# Patient Record
Sex: Male | Born: 1935
Health system: Southern US, Community
[De-identification: ages and names within clinical notes are randomized; demographics above are authoritative.]

## PROBLEM LIST (undated history)

## (undated) DIAGNOSIS — N183 Chronic kidney disease, stage 3 unspecified: Secondary | ICD-10-CM

## (undated) DIAGNOSIS — N400 Enlarged prostate without lower urinary tract symptoms: Secondary | ICD-10-CM

## (undated) DIAGNOSIS — E119 Type 2 diabetes mellitus without complications: Secondary | ICD-10-CM

## (undated) DIAGNOSIS — E7800 Pure hypercholesterolemia, unspecified: Secondary | ICD-10-CM

## (undated) DIAGNOSIS — I10 Essential (primary) hypertension: Secondary | ICD-10-CM

## (undated) HISTORY — PX: PROSTATE SURGERY: SHX751

## (undated) HISTORY — PX: OTHER SURGICAL HISTORY: SHX169

---

## 2005-05-02 ENCOUNTER — Ambulatory Visit: Payer: Self-pay | Admitting: Internal Medicine

## 2005-05-13 ENCOUNTER — Ambulatory Visit: Payer: Self-pay | Admitting: Internal Medicine

## 2005-06-13 ENCOUNTER — Ambulatory Visit: Payer: Self-pay | Admitting: Internal Medicine

## 2005-07-14 ENCOUNTER — Ambulatory Visit: Payer: Self-pay | Admitting: Internal Medicine

## 2005-08-11 ENCOUNTER — Ambulatory Visit: Payer: Self-pay | Admitting: Internal Medicine

## 2006-11-20 ENCOUNTER — Other Ambulatory Visit: Payer: Self-pay

## 2006-11-21 ENCOUNTER — Other Ambulatory Visit: Payer: Self-pay

## 2006-11-21 ENCOUNTER — Inpatient Hospital Stay: Payer: Self-pay | Admitting: Internal Medicine

## 2006-12-13 ENCOUNTER — Ambulatory Visit: Payer: Self-pay | Admitting: Internal Medicine

## 2007-01-12 ENCOUNTER — Ambulatory Visit: Payer: Self-pay | Admitting: Internal Medicine

## 2007-11-05 ENCOUNTER — Emergency Department: Payer: Self-pay | Admitting: Emergency Medicine

## 2007-12-04 ENCOUNTER — Ambulatory Visit: Payer: Self-pay | Admitting: Gastroenterology

## 2015-02-09 ENCOUNTER — Other Ambulatory Visit: Payer: Self-pay | Admitting: Internal Medicine

## 2015-02-09 ENCOUNTER — Ambulatory Visit
Admission: RE | Admit: 2015-02-09 | Discharge: 2015-02-09 | Disposition: A | Discharge status: Home / Self Care | Payer: PPO | Source: Ambulatory Visit | Attending: Internal Medicine | Admitting: Internal Medicine

## 2015-02-09 DIAGNOSIS — R600 Localized edema: Secondary | ICD-10-CM | POA: Diagnosis present

## 2015-08-25 DIAGNOSIS — Z794 Long term (current) use of insulin: Secondary | ICD-10-CM | POA: Diagnosis not present

## 2015-08-25 DIAGNOSIS — N183 Chronic kidney disease, stage 3 (moderate): Secondary | ICD-10-CM | POA: Diagnosis not present

## 2015-08-25 DIAGNOSIS — E1122 Type 2 diabetes mellitus with diabetic chronic kidney disease: Secondary | ICD-10-CM | POA: Diagnosis not present

## 2015-09-01 DIAGNOSIS — E11649 Type 2 diabetes mellitus with hypoglycemia without coma: Secondary | ICD-10-CM | POA: Diagnosis not present

## 2015-09-01 DIAGNOSIS — N183 Chronic kidney disease, stage 3 (moderate): Secondary | ICD-10-CM | POA: Diagnosis not present

## 2015-09-01 DIAGNOSIS — E1122 Type 2 diabetes mellitus with diabetic chronic kidney disease: Secondary | ICD-10-CM | POA: Diagnosis not present

## 2015-09-01 DIAGNOSIS — E1129 Type 2 diabetes mellitus with other diabetic kidney complication: Secondary | ICD-10-CM | POA: Diagnosis not present

## 2015-09-01 DIAGNOSIS — Z794 Long term (current) use of insulin: Secondary | ICD-10-CM | POA: Diagnosis not present

## 2015-09-01 DIAGNOSIS — R809 Proteinuria, unspecified: Secondary | ICD-10-CM | POA: Diagnosis not present

## 2015-11-16 DIAGNOSIS — E1122 Type 2 diabetes mellitus with diabetic chronic kidney disease: Secondary | ICD-10-CM | POA: Diagnosis not present

## 2015-11-16 DIAGNOSIS — I10 Essential (primary) hypertension: Secondary | ICD-10-CM | POA: Diagnosis not present

## 2015-11-16 DIAGNOSIS — Z794 Long term (current) use of insulin: Secondary | ICD-10-CM | POA: Diagnosis not present

## 2015-11-16 DIAGNOSIS — E785 Hyperlipidemia, unspecified: Secondary | ICD-10-CM | POA: Diagnosis not present

## 2015-11-16 DIAGNOSIS — N183 Chronic kidney disease, stage 3 (moderate): Secondary | ICD-10-CM | POA: Diagnosis not present

## 2015-11-23 DIAGNOSIS — Z794 Long term (current) use of insulin: Secondary | ICD-10-CM | POA: Diagnosis not present

## 2015-11-23 DIAGNOSIS — I10 Essential (primary) hypertension: Secondary | ICD-10-CM | POA: Diagnosis not present

## 2015-11-23 DIAGNOSIS — E1122 Type 2 diabetes mellitus with diabetic chronic kidney disease: Secondary | ICD-10-CM | POA: Diagnosis not present

## 2015-11-23 DIAGNOSIS — G3184 Mild cognitive impairment, so stated: Secondary | ICD-10-CM | POA: Diagnosis not present

## 2015-11-23 DIAGNOSIS — N401 Enlarged prostate with lower urinary tract symptoms: Secondary | ICD-10-CM | POA: Diagnosis not present

## 2015-11-23 DIAGNOSIS — Z125 Encounter for screening for malignant neoplasm of prostate: Secondary | ICD-10-CM | POA: Diagnosis not present

## 2015-11-23 DIAGNOSIS — E785 Hyperlipidemia, unspecified: Secondary | ICD-10-CM | POA: Diagnosis not present

## 2015-11-23 DIAGNOSIS — N183 Chronic kidney disease, stage 3 (moderate): Secondary | ICD-10-CM | POA: Diagnosis not present

## 2016-02-29 DIAGNOSIS — N183 Chronic kidney disease, stage 3 (moderate): Secondary | ICD-10-CM | POA: Diagnosis not present

## 2016-02-29 DIAGNOSIS — E1122 Type 2 diabetes mellitus with diabetic chronic kidney disease: Secondary | ICD-10-CM | POA: Diagnosis not present

## 2016-02-29 DIAGNOSIS — Z794 Long term (current) use of insulin: Secondary | ICD-10-CM | POA: Diagnosis not present

## 2016-03-08 DIAGNOSIS — E1122 Type 2 diabetes mellitus with diabetic chronic kidney disease: Secondary | ICD-10-CM | POA: Diagnosis not present

## 2016-03-08 DIAGNOSIS — N183 Chronic kidney disease, stage 3 (moderate): Secondary | ICD-10-CM | POA: Diagnosis not present

## 2016-03-08 DIAGNOSIS — Z794 Long term (current) use of insulin: Secondary | ICD-10-CM | POA: Diagnosis not present

## 2016-03-08 DIAGNOSIS — R809 Proteinuria, unspecified: Secondary | ICD-10-CM | POA: Diagnosis not present

## 2016-03-08 DIAGNOSIS — E1129 Type 2 diabetes mellitus with other diabetic kidney complication: Secondary | ICD-10-CM | POA: Diagnosis not present

## 2016-04-11 DIAGNOSIS — H401132 Primary open-angle glaucoma, bilateral, moderate stage: Secondary | ICD-10-CM | POA: Diagnosis not present

## 2016-05-19 DIAGNOSIS — N183 Chronic kidney disease, stage 3 (moderate): Secondary | ICD-10-CM | POA: Diagnosis not present

## 2016-05-19 DIAGNOSIS — I10 Essential (primary) hypertension: Secondary | ICD-10-CM | POA: Diagnosis not present

## 2016-05-19 DIAGNOSIS — Z125 Encounter for screening for malignant neoplasm of prostate: Secondary | ICD-10-CM | POA: Diagnosis not present

## 2016-05-24 DIAGNOSIS — Z794 Long term (current) use of insulin: Secondary | ICD-10-CM | POA: Diagnosis not present

## 2016-05-24 DIAGNOSIS — N401 Enlarged prostate with lower urinary tract symptoms: Secondary | ICD-10-CM | POA: Diagnosis not present

## 2016-05-24 DIAGNOSIS — E1122 Type 2 diabetes mellitus with diabetic chronic kidney disease: Secondary | ICD-10-CM | POA: Diagnosis not present

## 2016-05-24 DIAGNOSIS — R350 Frequency of micturition: Secondary | ICD-10-CM | POA: Diagnosis not present

## 2016-05-24 DIAGNOSIS — I10 Essential (primary) hypertension: Secondary | ICD-10-CM | POA: Diagnosis not present

## 2016-05-24 DIAGNOSIS — Z0000 Encounter for general adult medical examination without abnormal findings: Secondary | ICD-10-CM | POA: Diagnosis not present

## 2016-05-24 DIAGNOSIS — N183 Chronic kidney disease, stage 3 (moderate): Secondary | ICD-10-CM | POA: Diagnosis not present

## 2016-05-24 DIAGNOSIS — E785 Hyperlipidemia, unspecified: Secondary | ICD-10-CM | POA: Diagnosis not present

## 2016-07-06 DIAGNOSIS — H401132 Primary open-angle glaucoma, bilateral, moderate stage: Secondary | ICD-10-CM | POA: Diagnosis not present

## 2016-07-15 DIAGNOSIS — H401132 Primary open-angle glaucoma, bilateral, moderate stage: Secondary | ICD-10-CM | POA: Diagnosis not present

## 2016-08-29 DIAGNOSIS — E1122 Type 2 diabetes mellitus with diabetic chronic kidney disease: Secondary | ICD-10-CM | POA: Diagnosis not present

## 2016-08-29 DIAGNOSIS — Z794 Long term (current) use of insulin: Secondary | ICD-10-CM | POA: Diagnosis not present

## 2016-08-29 DIAGNOSIS — N183 Chronic kidney disease, stage 3 (moderate): Secondary | ICD-10-CM | POA: Diagnosis not present

## 2016-09-07 DIAGNOSIS — R809 Proteinuria, unspecified: Secondary | ICD-10-CM | POA: Diagnosis not present

## 2016-09-07 DIAGNOSIS — Z794 Long term (current) use of insulin: Secondary | ICD-10-CM | POA: Diagnosis not present

## 2016-09-07 DIAGNOSIS — E1129 Type 2 diabetes mellitus with other diabetic kidney complication: Secondary | ICD-10-CM | POA: Diagnosis not present

## 2016-09-07 DIAGNOSIS — N183 Chronic kidney disease, stage 3 (moderate): Secondary | ICD-10-CM | POA: Diagnosis not present

## 2016-09-07 DIAGNOSIS — E1122 Type 2 diabetes mellitus with diabetic chronic kidney disease: Secondary | ICD-10-CM | POA: Diagnosis not present

## 2016-09-29 ENCOUNTER — Emergency Department: Payer: PPO

## 2016-09-29 ENCOUNTER — Emergency Department
Admission: EM | Admit: 2016-09-29 | Discharge: 2016-09-30 | Disposition: A | Discharge status: Home / Self Care | Payer: PPO | Attending: Emergency Medicine | Admitting: Emergency Medicine

## 2016-09-29 ENCOUNTER — Encounter: Payer: Self-pay | Admitting: Emergency Medicine

## 2016-09-29 DIAGNOSIS — I10 Essential (primary) hypertension: Secondary | ICD-10-CM | POA: Insufficient documentation

## 2016-09-29 DIAGNOSIS — R778 Other specified abnormalities of plasma proteins: Secondary | ICD-10-CM

## 2016-09-29 DIAGNOSIS — E119 Type 2 diabetes mellitus without complications: Secondary | ICD-10-CM | POA: Insufficient documentation

## 2016-09-29 DIAGNOSIS — N289 Disorder of kidney and ureter, unspecified: Secondary | ICD-10-CM

## 2016-09-29 DIAGNOSIS — R7989 Other specified abnormal findings of blood chemistry: Secondary | ICD-10-CM | POA: Insufficient documentation

## 2016-09-29 DIAGNOSIS — Z87891 Personal history of nicotine dependence: Secondary | ICD-10-CM | POA: Insufficient documentation

## 2016-09-29 HISTORY — DX: Pure hypercholesterolemia, unspecified: E78.00

## 2016-09-29 HISTORY — DX: Essential (primary) hypertension: I10

## 2016-09-29 HISTORY — DX: Type 2 diabetes mellitus without complications: E11.9

## 2016-09-29 LAB — CBC
HCT: 36.3 % — ABNORMAL LOW (ref 40.0–52.0)
HEMOGLOBIN: 12.3 g/dL — AB (ref 13.0–18.0)
MCH: 32.1 pg (ref 26.0–34.0)
MCHC: 33.9 g/dL (ref 32.0–36.0)
MCV: 94.8 fL (ref 80.0–100.0)
Platelets: 325 10*3/uL (ref 150–440)
RBC: 3.83 MIL/uL — AB (ref 4.40–5.90)
RDW: 12.3 % (ref 11.5–14.5)
WBC: 6.4 10*3/uL (ref 3.8–10.6)

## 2016-09-29 LAB — BASIC METABOLIC PANEL
ANION GAP: 7 (ref 5–15)
BUN: 31 mg/dL — ABNORMAL HIGH (ref 6–20)
CALCIUM: 9.4 mg/dL (ref 8.9–10.3)
CO2: 27 mmol/L (ref 22–32)
Chloride: 101 mmol/L (ref 101–111)
Creatinine, Ser: 1.76 mg/dL — ABNORMAL HIGH (ref 0.61–1.24)
GFR, EST AFRICAN AMERICAN: 39 mL/min — AB (ref >60)
GFR, EST NON AFRICAN AMERICAN: 34 mL/min — AB (ref >60)
Glucose, Bld: 142 mg/dL — ABNORMAL HIGH (ref 65–99)
Potassium: 4.4 mmol/L (ref 3.5–5.1)
SODIUM: 135 mmol/L (ref 135–145)

## 2016-09-29 LAB — TROPONIN I
TROPONIN I: 0.15 ng/mL — AB (ref <0.03)
TROPONIN I: 0.15 ng/mL — AB (ref <0.03)

## 2016-09-29 MED ORDER — ASPIRIN 81 MG PO CHEW
324.0000 mg | CHEWABLE_TABLET | Freq: Once | ORAL | Status: AC
  Administered 2016-09-29: 243 mg via ORAL

## 2016-09-29 MED ORDER — ASPIRIN 81 MG PO CHEW
CHEWABLE_TABLET | ORAL | Status: AC
  Filled 2016-09-29: qty 4

## 2016-09-29 NOTE — ED Provider Notes (Signed)
Clinical Course as of Apr 20 0000  Thu Sep 29, 2016  2330 Assuming care from Dr. Burlene Arnt vis Dr. Kerman Passey.  In short, William Morse is a 83 y.o. male with a chief complaint of hypertension and elevated troponin I.  Refer to the original H&P for additional details.  The current plan of care is to follow up the repeat troponin.  If patient remains asymptomatic and repeat troponin is essentially unchanged, the patient will be discharged for outpatient follow up.  At no point has he had any chest pain nor shortness of breath.   [CF]  Fri Sep 30, 2016  0000 The patient has been stable.  I reassessed him and he has remained asymptomatic.  He and his son are comfortable with the plan for discharge and outpatient follow-up.  His second troponin was unchanged at 0.15.  I gave my usual and customary return precautions.  [CF]    Clinical Course User Index [CF] Hinda Kehr, MD      Hinda Kehr, MD 09/30/16 0000

## 2016-09-29 NOTE — ED Notes (Signed)
Dr. Kerman Passey gave verbal order for pt to take usual home med of lisinopril HCTZ at this time from own meds that pt brought with him.

## 2016-09-29 NOTE — ED Provider Notes (Addendum)
[facility] Emergency Department Provider Note  ____________________________________________   I have reviewed the triage vital signs and the nursing notes.   HISTORY  Chief Complaint Hypertension    HPI William Morse is a 83 y.o. male who is hypertensive and has a history of diabetes. Patient was watching the evening news, he became upset about the way the world is going, and then he felt that he should check his blood pressure. He checked his blood pressure and it was elevated. He states at no time did he have any chest pain or shortness of breath he denies any nausea or vomiting or abdominal pain he denies any headache or any symptoms with this elevated blood pressure. He did take his blood pressure medication earlier today. He comes in therefore complaining of elevated blood pressure but no other complaints. In triage, the patient had blood work drawn including a troponin by protocol. Patient has a known left bundle-branch block from prior EKGs. At this time he has no complaints.  He has no known significant heart disease. His baseline creatinine is 1.7 from clinic results.  Past Medical History:  Diagnosis Date  . Diabetes mellitus without complication ([place])   . Hypercholesteremia   . Hypertension     There are no active problems to display for this patient.   History reviewed. No pertinent surgical history.  Prior to Admission medications   Not on File    Allergies Patient has no known allergies.  No family history on file.  Social History Social History  Substance Use Topics  . Smoking status: Former Research scientist (life sciences)  . Smokeless tobacco: Never Used  . Alcohol use Not on file    Review of Systems Constitutional: No fever/chills Eyes: No visual changes. ENT: No sore throat. No stiff neck no neck pain Cardiovascular: Denies chest pain. Respiratory: Denies shortness of breath. Gastrointestinal:   no vomiting.  No diarrhea.  No  constipation. Genitourinary: Negative for dysuria. Musculoskeletal: Negative lower extremity swelling Skin: Negative for rash. Neurological: Negative for severe headaches, focal weakness or numbness. 10-point ROS otherwise negative.  ____________________________________________   PHYSICAL EXAM:  VITAL SIGNS: ED Triage Vitals  Enc Vitals Group     BP 09/29/16 1922 (!) 177/74     Pulse Rate 09/29/16 1922 83     Resp 09/29/16 1922 18     Temp 09/29/16 1922 97.7 F (36.5 C)     Temp Source 09/29/16 1922 Oral     SpO2 09/29/16 1922 99 %     Weight 09/29/16 1923 175 lb (79.4 kg)     Height 09/29/16 1923 5\' 7"  (1.702 m)     Head Circumference --      Peak Flow --      Pain Score --      Pain Loc --      Pain Edu? --      Excl. in [place]? --     Constitutional: Alert and oriented. Well appearing and in no acute distress. Eyes: Conjunctivae are normal. PERRL. EOMI. Head: Atraumatic. Nose: No congestion/rhinnorhea. Mouth/Throat: Mucous membranes are moist.  Oropharynx non-erythematous. Neck: No stridor.   Nontender with no meningismus Cardiovascular: Normal rate, regular rhythm. Grossly normal heart sounds.  Good peripheral circulation. Respiratory: Normal respiratory effort.  No retractions. Lungs CTAB. Abdominal: Soft and nontender. No distention. No guarding no rebound Back:  There is no focal tenderness or step off.  there is no midline tenderness there are no lesions noted. there is no CVA tenderness  Musculoskeletal: No lower extremity tenderness, no upper extremity tenderness. No joint effusions, no DVT signs strong distal pulses no edema Neurologic:  Normal speech and language. No gross focal neurologic deficits are appreciated.  Skin:  Skin is warm, dry and intact. No rash noted. Psychiatric: Mood and affect are normal. Speech and behavior are normal.  ____________________________________________   LABS (all labs ordered are listed, but only abnormal results are  displayed)  Labs Reviewed  BASIC METABOLIC PANEL - Abnormal; Notable for the following:       Result Value   Glucose, Bld 142 (*)    BUN 31 (*)    Creatinine, Ser 1.76 (*)    GFR calc non Af Amer 34 (*)    GFR calc Af Amer 39 (*)    All other components within normal limits  CBC - Abnormal; Notable for the following:    RBC 3.83 (*)    Hemoglobin 12.3 (*)    HCT 36.3 (*)    All other components within normal limits  TROPONIN I - Abnormal; Notable for the following:    Troponin I 0.15 (*)    All other components within normal limits   ____________________________________________  EKG  I personally interpreted any EKGs ordered by me or triage No significant change from prior. Rate 75 bpm, no significant change from prior ____________________________________________  RADIOLOGY  I reviewed any imaging ordered by me or triage that were performed during my shift and, if possible, patient and/or family made aware of any abnormal findings. ____________________________________________   PROCEDURES  Procedure(s) performed: None  Procedures  Critical Care performed: None  ____________________________________________   INITIAL IMPRESSION / ASSESSMENT AND PLAN / ED COURSE  Pertinent labs & imaging results that were available during my care of the patient were reviewed by me and considered in my medical decision making (see chart for details).  Patient here with no symptoms or complaints aside from he noticed his blood pressure was up when he was anxious about the TV that he was watching. Unfortunately, by protocol we did obtain blood work which showed a troponin of 0.15. Patient does have a renal insufficiency with a low GFR, which certainly could affect his troponin. He persists in having no symptoms his blood pressures correcting itself as he relaxes. Patient and family feel that he was just anxious. He had no chest pain or any other symptoms. I did discuss with cardiology, Dr.  Ubaldo Glassing, all of these findings including patient's EKG, baseline EKG, troponin, history and physical and vitals and lab findings. He feels the patient should've a second troponin and given that the patient is asymptomatic, if he remains without any significant increase in his elevation of his troponin he is to go home and see them tomorrow in the clinic. If his troponin goes higher, he will need to be admitted. Patient reluctantly agrees to stay, he has no complaints or symptoms and he would like to go home but he does understand our reasoning and does consent to stay for further testing.   ----------------------------------------- 10:19 PM on 09/29/2016 -----------------------------------------  She received aspirin remains asymptomatic signed out at the end of my shift to Dr. Kerman Passey who will follow-up on troponin and pt dispo    ____________________________________________   FINAL CLINICAL IMPRESSION(S) / ED DIAGNOSES  Final diagnoses:  None      This chart was dictated using voice recognition software.  Despite best efforts to proofread,  errors can occur which can change meaning.  Schuyler Amor, MD 09/29/16 2132    Schuyler Amor, MD 09/29/16 2220

## 2016-09-29 NOTE — ED Triage Notes (Signed)
Patient states that he has a history of hypertension that is normally well controlled with his medications. Patient reports that today at home his blood pressure was 200/103. Patient went to urgent care and had a blood pressure of 180/90 and was told to come to ED.

## 2016-09-29 NOTE — ED Notes (Signed)
Patient states he did take his bp medication this morning. Normal bp for patient is 957'M systolic. Patient denies any headache, blurred vision or dizziness.

## 2016-09-30 NOTE — Discharge Instructions (Signed)
As we discussed, though you do have high blood pressure (hypertension), fortunately it is not immediately dangerous at this time and does not need emergency intervention or admission to the hospital.  If we add to or change your regular medications, we may cause more harm than good - it is more appropriate for your primary care doctor to evaluate you in clinic and decide if any medication changes are needed.  Please follow up in clinic as recommended in these papers.    We do strongly encourage you to follow up with your regular doctor to discuss your kidney function and your elevated troponin.  Since you have had no chest pain or shortness of breath, and the rest of your evaluation is reassuring, there is no need to keep you in the hospital, but it is important to follow-up as an outpatient.  Return to the Emergency Department (ED) if you experience any worsening chest pain/pressure/tightness, difficulty breathing, or sudden sweating, or other symptoms that concern you.

## 2016-10-26 DIAGNOSIS — H401132 Primary open-angle glaucoma, bilateral, moderate stage: Secondary | ICD-10-CM | POA: Diagnosis not present

## 2016-11-21 DIAGNOSIS — N183 Chronic kidney disease, stage 3 (moderate): Secondary | ICD-10-CM | POA: Diagnosis not present

## 2016-11-21 DIAGNOSIS — E785 Hyperlipidemia, unspecified: Secondary | ICD-10-CM | POA: Diagnosis not present

## 2016-11-23 DIAGNOSIS — E785 Hyperlipidemia, unspecified: Secondary | ICD-10-CM | POA: Diagnosis not present

## 2016-11-23 DIAGNOSIS — G3184 Mild cognitive impairment, so stated: Secondary | ICD-10-CM | POA: Diagnosis not present

## 2016-11-23 DIAGNOSIS — Z794 Long term (current) use of insulin: Secondary | ICD-10-CM | POA: Diagnosis not present

## 2016-11-23 DIAGNOSIS — N183 Chronic kidney disease, stage 3 (moderate): Secondary | ICD-10-CM | POA: Diagnosis not present

## 2016-11-23 DIAGNOSIS — I10 Essential (primary) hypertension: Secondary | ICD-10-CM | POA: Diagnosis not present

## 2016-11-23 DIAGNOSIS — E1122 Type 2 diabetes mellitus with diabetic chronic kidney disease: Secondary | ICD-10-CM | POA: Diagnosis not present

## 2016-11-23 DIAGNOSIS — N529 Male erectile dysfunction, unspecified: Secondary | ICD-10-CM | POA: Diagnosis not present

## 2017-03-21 DIAGNOSIS — E1122 Type 2 diabetes mellitus with diabetic chronic kidney disease: Secondary | ICD-10-CM | POA: Diagnosis not present

## 2017-03-21 DIAGNOSIS — Z794 Long term (current) use of insulin: Secondary | ICD-10-CM | POA: Diagnosis not present

## 2017-03-21 DIAGNOSIS — E1129 Type 2 diabetes mellitus with other diabetic kidney complication: Secondary | ICD-10-CM | POA: Diagnosis not present

## 2017-03-21 DIAGNOSIS — R809 Proteinuria, unspecified: Secondary | ICD-10-CM | POA: Diagnosis not present

## 2017-03-21 DIAGNOSIS — N183 Chronic kidney disease, stage 3 (moderate): Secondary | ICD-10-CM | POA: Diagnosis not present

## 2017-03-28 DIAGNOSIS — Z794 Long term (current) use of insulin: Secondary | ICD-10-CM | POA: Diagnosis not present

## 2017-03-28 DIAGNOSIS — E1122 Type 2 diabetes mellitus with diabetic chronic kidney disease: Secondary | ICD-10-CM | POA: Diagnosis not present

## 2017-03-28 DIAGNOSIS — E1129 Type 2 diabetes mellitus with other diabetic kidney complication: Secondary | ICD-10-CM | POA: Diagnosis not present

## 2017-03-28 DIAGNOSIS — N183 Chronic kidney disease, stage 3 (moderate): Secondary | ICD-10-CM | POA: Diagnosis not present

## 2017-03-28 DIAGNOSIS — R809 Proteinuria, unspecified: Secondary | ICD-10-CM | POA: Diagnosis not present

## 2017-05-19 DIAGNOSIS — E785 Hyperlipidemia, unspecified: Secondary | ICD-10-CM | POA: Diagnosis not present

## 2017-05-19 DIAGNOSIS — I10 Essential (primary) hypertension: Secondary | ICD-10-CM | POA: Diagnosis not present

## 2017-05-19 DIAGNOSIS — N183 Chronic kidney disease, stage 3 (moderate): Secondary | ICD-10-CM | POA: Diagnosis not present

## 2017-05-26 DIAGNOSIS — Z0000 Encounter for general adult medical examination without abnormal findings: Secondary | ICD-10-CM | POA: Diagnosis not present

## 2017-05-26 DIAGNOSIS — N183 Chronic kidney disease, stage 3 (moderate): Secondary | ICD-10-CM | POA: Diagnosis not present

## 2017-05-26 DIAGNOSIS — G3184 Mild cognitive impairment, so stated: Secondary | ICD-10-CM | POA: Diagnosis not present

## 2017-05-26 DIAGNOSIS — Z794 Long term (current) use of insulin: Secondary | ICD-10-CM | POA: Diagnosis not present

## 2017-05-26 DIAGNOSIS — R350 Frequency of micturition: Secondary | ICD-10-CM | POA: Diagnosis not present

## 2017-05-26 DIAGNOSIS — I10 Essential (primary) hypertension: Secondary | ICD-10-CM | POA: Diagnosis not present

## 2017-05-26 DIAGNOSIS — E785 Hyperlipidemia, unspecified: Secondary | ICD-10-CM | POA: Diagnosis not present

## 2017-05-26 DIAGNOSIS — N401 Enlarged prostate with lower urinary tract symptoms: Secondary | ICD-10-CM | POA: Diagnosis not present

## 2017-05-26 DIAGNOSIS — E1122 Type 2 diabetes mellitus with diabetic chronic kidney disease: Secondary | ICD-10-CM | POA: Diagnosis not present

## 2017-05-26 DIAGNOSIS — Z8659 Personal history of other mental and behavioral disorders: Secondary | ICD-10-CM | POA: Diagnosis not present

## 2017-07-05 ENCOUNTER — Encounter: Payer: Self-pay | Admitting: Emergency Medicine

## 2017-07-05 ENCOUNTER — Emergency Department: Payer: PPO

## 2017-07-05 ENCOUNTER — Other Ambulatory Visit: Payer: Self-pay

## 2017-07-05 ENCOUNTER — Inpatient Hospital Stay
Admission: EM | Admit: 2017-07-05 | Discharge: 2017-07-13 | DRG: 643 | Disposition: A | Discharge status: Skilled Nursing Facility | Payer: PPO | Attending: Internal Medicine | Admitting: Internal Medicine

## 2017-07-05 DIAGNOSIS — J9690 Respiratory failure, unspecified, unspecified whether with hypoxia or hypercapnia: Secondary | ICD-10-CM

## 2017-07-05 DIAGNOSIS — I130 Hypertensive heart and chronic kidney disease with heart failure and stage 1 through stage 4 chronic kidney disease, or unspecified chronic kidney disease: Secondary | ICD-10-CM | POA: Diagnosis not present

## 2017-07-05 DIAGNOSIS — R7989 Other specified abnormal findings of blood chemistry: Secondary | ICD-10-CM | POA: Diagnosis not present

## 2017-07-05 DIAGNOSIS — E222 Syndrome of inappropriate secretion of antidiuretic hormone: Principal | ICD-10-CM | POA: Diagnosis present

## 2017-07-05 DIAGNOSIS — E785 Hyperlipidemia, unspecified: Secondary | ICD-10-CM | POA: Diagnosis not present

## 2017-07-05 DIAGNOSIS — T502X5A Adverse effect of carbonic-anhydrase inhibitors, benzothiadiazides and other diuretics, initial encounter: Secondary | ICD-10-CM | POA: Diagnosis not present

## 2017-07-05 DIAGNOSIS — R809 Proteinuria, unspecified: Secondary | ICD-10-CM | POA: Diagnosis not present

## 2017-07-05 DIAGNOSIS — E7849 Other hyperlipidemia: Secondary | ICD-10-CM | POA: Diagnosis not present

## 2017-07-05 DIAGNOSIS — N400 Enlarged prostate without lower urinary tract symptoms: Secondary | ICD-10-CM | POA: Diagnosis present

## 2017-07-05 DIAGNOSIS — D291 Benign neoplasm of prostate: Secondary | ICD-10-CM | POA: Diagnosis not present

## 2017-07-05 DIAGNOSIS — E7800 Pure hypercholesterolemia, unspecified: Secondary | ICD-10-CM | POA: Diagnosis not present

## 2017-07-05 DIAGNOSIS — E878 Other disorders of electrolyte and fluid balance, not elsewhere classified: Secondary | ICD-10-CM | POA: Diagnosis not present

## 2017-07-05 DIAGNOSIS — I447 Left bundle-branch block, unspecified: Secondary | ICD-10-CM | POA: Diagnosis not present

## 2017-07-05 DIAGNOSIS — R0602 Shortness of breath: Secondary | ICD-10-CM

## 2017-07-05 DIAGNOSIS — K567 Ileus, unspecified: Secondary | ICD-10-CM | POA: Diagnosis present

## 2017-07-05 DIAGNOSIS — N182 Chronic kidney disease, stage 2 (mild): Secondary | ICD-10-CM | POA: Diagnosis not present

## 2017-07-05 DIAGNOSIS — R140 Abdominal distension (gaseous): Secondary | ICD-10-CM

## 2017-07-05 DIAGNOSIS — Z7401 Bed confinement status: Secondary | ICD-10-CM | POA: Diagnosis not present

## 2017-07-05 DIAGNOSIS — Z7982 Long term (current) use of aspirin: Secondary | ICD-10-CM | POA: Diagnosis not present

## 2017-07-05 DIAGNOSIS — Z809 Family history of malignant neoplasm, unspecified: Secondary | ICD-10-CM | POA: Diagnosis not present

## 2017-07-05 DIAGNOSIS — M6281 Muscle weakness (generalized): Secondary | ICD-10-CM | POA: Diagnosis not present

## 2017-07-05 DIAGNOSIS — G9340 Encephalopathy, unspecified: Secondary | ICD-10-CM | POA: Diagnosis not present

## 2017-07-05 DIAGNOSIS — I340 Nonrheumatic mitral (valve) insufficiency: Secondary | ICD-10-CM | POA: Diagnosis not present

## 2017-07-05 DIAGNOSIS — Z87891 Personal history of nicotine dependence: Secondary | ICD-10-CM | POA: Diagnosis not present

## 2017-07-05 DIAGNOSIS — E1122 Type 2 diabetes mellitus with diabetic chronic kidney disease: Secondary | ICD-10-CM | POA: Diagnosis present

## 2017-07-05 DIAGNOSIS — J810 Acute pulmonary edema: Secondary | ICD-10-CM

## 2017-07-05 DIAGNOSIS — E119 Type 2 diabetes mellitus without complications: Secondary | ICD-10-CM

## 2017-07-05 DIAGNOSIS — Z833 Family history of diabetes mellitus: Secondary | ICD-10-CM

## 2017-07-05 DIAGNOSIS — Z8249 Family history of ischemic heart disease and other diseases of the circulatory system: Secondary | ICD-10-CM

## 2017-07-05 DIAGNOSIS — G9341 Metabolic encephalopathy: Secondary | ICD-10-CM | POA: Diagnosis present

## 2017-07-05 DIAGNOSIS — N183 Chronic kidney disease, stage 3 unspecified: Secondary | ICD-10-CM | POA: Diagnosis present

## 2017-07-05 DIAGNOSIS — R4182 Altered mental status, unspecified: Secondary | ICD-10-CM

## 2017-07-05 DIAGNOSIS — J9601 Acute respiratory failure with hypoxia: Secondary | ICD-10-CM | POA: Diagnosis not present

## 2017-07-05 DIAGNOSIS — J189 Pneumonia, unspecified organism: Secondary | ICD-10-CM | POA: Diagnosis present

## 2017-07-05 DIAGNOSIS — E871 Hypo-osmolality and hyponatremia: Secondary | ICD-10-CM | POA: Diagnosis present

## 2017-07-05 DIAGNOSIS — R2681 Unsteadiness on feet: Secondary | ICD-10-CM | POA: Diagnosis not present

## 2017-07-05 DIAGNOSIS — E870 Hyperosmolality and hypernatremia: Secondary | ICD-10-CM | POA: Diagnosis present

## 2017-07-05 DIAGNOSIS — I10 Essential (primary) hypertension: Secondary | ICD-10-CM | POA: Diagnosis present

## 2017-07-05 DIAGNOSIS — I5033 Acute on chronic diastolic (congestive) heart failure: Secondary | ICD-10-CM | POA: Diagnosis present

## 2017-07-05 DIAGNOSIS — N179 Acute kidney failure, unspecified: Secondary | ICD-10-CM | POA: Diagnosis not present

## 2017-07-05 DIAGNOSIS — I129 Hypertensive chronic kidney disease with stage 1 through stage 4 chronic kidney disease, or unspecified chronic kidney disease: Secondary | ICD-10-CM | POA: Diagnosis not present

## 2017-07-05 DIAGNOSIS — F3289 Other specified depressive episodes: Secondary | ICD-10-CM | POA: Diagnosis not present

## 2017-07-05 HISTORY — DX: Chronic kidney disease, stage 3 unspecified: N18.30

## 2017-07-05 HISTORY — DX: Chronic kidney disease, stage 3 (moderate): N18.3

## 2017-07-05 HISTORY — DX: Benign prostatic hyperplasia without lower urinary tract symptoms: N40.0

## 2017-07-05 LAB — URINALYSIS, COMPLETE (UACMP) WITH MICROSCOPIC
Bacteria, UA: NONE SEEN
Bilirubin Urine: NEGATIVE
GLUCOSE, UA: NEGATIVE mg/dL
Ketones, ur: NEGATIVE mg/dL
Leukocytes, UA: NEGATIVE
Nitrite: NEGATIVE
PH: 5 (ref 5.0–8.0)
Protein, ur: 30 mg/dL — AB
SQUAMOUS EPITHELIAL / LPF: NONE SEEN
Specific Gravity, Urine: 1.01 (ref 1.005–1.030)

## 2017-07-05 LAB — CBC
HCT: 35.1 % — ABNORMAL LOW (ref 40.0–52.0)
HEMOGLOBIN: 12.6 g/dL — AB (ref 13.0–18.0)
MCH: 32.2 pg (ref 26.0–34.0)
MCHC: 35.8 g/dL (ref 32.0–36.0)
MCV: 89.8 fL (ref 80.0–100.0)
Platelets: 290 10*3/uL (ref 150–440)
RBC: 3.91 MIL/uL — AB (ref 4.40–5.90)
RDW: 12.2 % (ref 11.5–14.5)
WBC: 7.7 10*3/uL (ref 3.8–10.6)

## 2017-07-05 LAB — TROPONIN I: TROPONIN I: 0.23 ng/mL — AB (ref <0.03)

## 2017-07-05 MED ORDER — FUROSEMIDE 10 MG/ML IJ SOLN
60.0000 mg | Freq: Once | INTRAMUSCULAR | Status: AC
  Administered 2017-07-05: 60 mg via INTRAVENOUS
  Filled 2017-07-05: qty 8

## 2017-07-05 MED ORDER — CEFTRIAXONE SODIUM IN DEXTROSE 20 MG/ML IV SOLN
1.0000 g | Freq: Once | INTRAVENOUS | Status: AC
  Administered 2017-07-05: 1 g via INTRAVENOUS
  Filled 2017-07-05: qty 50

## 2017-07-05 MED ORDER — SODIUM CHLORIDE 0.9 % IV SOLN
Freq: Once | INTRAVENOUS | Status: AC
  Administered 2017-07-05: 19:00:00 via INTRAVENOUS

## 2017-07-05 MED ORDER — DEXTROSE 5 % IV SOLN
500.0000 mg | Freq: Once | INTRAVENOUS | Status: AC
  Administered 2017-07-05: 500 mg via INTRAVENOUS
  Filled 2017-07-05: qty 500

## 2017-07-05 MED ORDER — DEXTROSE 5 % IV SOLN
1.0000 g | INTRAVENOUS | Status: DC
  Administered 2017-07-06 – 2017-07-10 (×5): 1 g via INTRAVENOUS
  Filled 2017-07-05 (×6): qty 10

## 2017-07-05 MED ORDER — SODIUM CHLORIDE 3 % IV SOLN
INTRAVENOUS | Status: AC
  Administered 2017-07-05: 50 mL/h via INTRAVENOUS
  Administered 2017-07-06: 40 mL/h via INTRAVENOUS
  Administered 2017-07-06: 50 mL/h via INTRAVENOUS
  Filled 2017-07-05 (×5): qty 500

## 2017-07-05 NOTE — ED Notes (Signed)
Called CCU to give report, CCU RN not avaliable

## 2017-07-05 NOTE — H&P (Signed)
[facility] [place] NAME: William Morse    MR#:  [id number]  DATE OF BIRTH:  12-12-1930  DATE OF ADMISSION:  07/05/2017  PRIMARY CARE PHYSICIAN: Leonel Ramsay, MD   REQUESTING/REFERRING PHYSICIAN: Kerman Passey, MD  CHIEF COMPLAINT:   Chief Complaint  Patient presents with  . Altered Mental Status    HISTORY OF PRESENT ILLNESS:  William Morse  is a 84 y.o. male who presents with confusion, weakness for the past 2 days.  Patient had an episode where he was largely unresponsive, and was brought by family to the ED.  Here on this writer's interview he is awake and alert.  He does state that he has been feeling weak over the past couple of days and then it has been getting progressively worse.  Evaluation here shows profound hyponatremia, and elevated troponin.  He has some opacities on his chest x-ray interpreted as pulmonary edema versus infiltrate.  Hospitals were called for admission and further treatment  PAST MEDICAL HISTORY:   Past Medical History:  Diagnosis Date  . BPH (benign prostatic hyperplasia)   . CKD (chronic kidney disease), stage III ([place])   . Diabetes mellitus without complication ([place])   . Hypercholesteremia   . Hypertension     PAST SURGICAL HISTORY:   Past Surgical History:  Procedure Laterality Date  . ORIF Left Arm    . PROSTATE SURGERY      SOCIAL HISTORY:   Social History   Tobacco Use  . Smoking status: Former Research scientist (life sciences)  . Smokeless tobacco: Never Used  Substance Use Topics  . Alcohol use: No    Frequency: Never    FAMILY HISTORY:   Family History  Problem Relation Age of Onset  . Seizures Brother   . Cancer Brother   . Hypertension Brother   . CAD Mother   . Heart attack Mother   . Diabetes Sister   . Hypertension Sister   . CAD Sister     DRUG ALLERGIES:  No Known Allergies  MEDICATIONS AT HOME:   Prior to Admission medications   Medication Sig Start Date End Date  Taking? Authorizing Provider  amLODipine (NORVASC) 10 MG tablet Take 10 mg by mouth daily.   Yes [provider]  aspirin EC 81 MG tablet Take 81 mg by mouth daily.   Yes [provider]  lisinopril-hydrochlorothiazide (PRINZIDE,ZESTORETIC) 20-12.5 MG tablet Take 1 tablet by mouth 2 (two) times daily.   Yes [provider]  metoprolol succinate (TOPROL-XL) 100 MG 24 hr tablet Take 100 mg by mouth daily. Take with or immediately following a meal.   Yes [provider]  sertraline (ZOLOFT) 50 MG tablet Take 50 mg by mouth daily.   Yes [provider]  simvastatin (ZOCOR) 80 MG tablet Take 80 mg by mouth daily.   Yes [provider]  tamsulosin (FLOMAX) 0.4 MG CAPS capsule Take 0.4 mg by mouth daily.   Yes [provider]    REVIEW OF SYSTEMS:  Review of Systems  Constitutional: Negative for chills, fever, malaise/fatigue and weight loss.  HENT: Negative for ear pain, hearing loss and tinnitus.   Eyes: Negative for blurred vision, double vision, pain and redness.  Respiratory: Positive for cough and shortness of breath. Negative for hemoptysis.   Cardiovascular: Negative for chest pain, palpitations, orthopnea and leg swelling.  Gastrointestinal: Negative for abdominal pain, constipation, diarrhea, nausea and vomiting.  Genitourinary: Negative for dysuria, frequency and hematuria.  Musculoskeletal: Negative for back pain, joint pain and neck pain.  Skin:       No acne, rash, or lesions  Neurological: Positive for weakness. Negative for dizziness, tremors and focal weakness.       Confusion  Endo/Heme/Allergies: Negative for polydipsia. Does not bruise/bleed easily.  Psychiatric/Behavioral: Negative for depression. The patient is not nervous/anxious and does not have insomnia.      VITAL SIGNS:   Vitals:   07/05/17 1930 07/05/17 2000 07/05/17 2030 07/05/17 2130  BP: (!) 189/73 (!) 133/116 (!) 158/83 (!) 141/98  Pulse: 64  62  60  Resp: 20 14 18  (!) 27  SpO2: 95%  97% 97%  Weight:      Height:       Wt Readings from Last 3 Encounters:  07/05/17 81.6 kg (180 lb)  09/29/16 79.4 kg (175 lb)    PHYSICAL EXAMINATION:  Physical Exam  Vitals reviewed. Constitutional: He is oriented to person, place, and time. He appears well-developed and well-nourished. No distress.  HENT:  Head: Normocephalic and atraumatic.  Mouth/Throat: Oropharynx is clear and moist.  Eyes: Conjunctivae and EOM are normal. Pupils are equal, round, and reactive to light. No scleral icterus.  Neck: Normal range of motion. Neck supple. No JVD present. No thyromegaly present.  Cardiovascular: Normal rate, regular rhythm and intact distal pulses. Exam reveals no gallop and no friction rub.  No murmur heard. Respiratory: Effort normal and breath sounds normal. No respiratory distress. He has no wheezes. He has no rales.  GI: Soft. Bowel sounds are normal. He exhibits no distension. There is no tenderness.  Musculoskeletal: Normal range of motion. He exhibits no edema.  No arthritis, no gout  Lymphadenopathy:    He has no cervical adenopathy.  Neurological: He is alert and oriented to person, place, and time. No cranial nerve deficit.  No dysarthria, no aphasia, no acute focal neurological deficit  Skin: Skin is warm and dry. No rash noted. No erythema.  Psychiatric: He has a normal mood and affect. His behavior is normal. Judgment and thought content normal.    LABORATORY PANEL:   CBC Recent Labs  Lab 07/05/17 1742  WBC 7.7  HGB 12.6*  HCT 35.1*  PLT 290   ------------------------------------------------------------------------------------------------------------------  Chemistries  Recent Labs  Lab 07/05/17 1742  NA 107*  K 4.3  CL 74*  CO2 22  GLUCOSE 204*  BUN 37*  CREATININE 1.35*  CALCIUM 7.9*  AST 141*  ALT 50  ALKPHOS 59  BILITOT 1.0    ------------------------------------------------------------------------------------------------------------------  Cardiac Enzymes Recent Labs  Lab 07/05/17 1742  TROPONINI 0.23*   ------------------------------------------------------------------------------------------------------------------  RADIOLOGY:  Dg Chest 2 View  Result Date: 07/05/2017 CLINICAL DATA:  Shortness of breath, dizziness and weakness. EXAM: CHEST  2 VIEW COMPARISON:  09/29/2016 FINDINGS: Grossly unchanged enlarged cardiac silhouette and mediastinal contours with atherosclerotic plaque within the thoracic aorta. The pulmonary vasculature appears indistinct with cephalization of flow. Worsening right mid and bilateral perihilar heterogeneous opacities. Trace bilateral effusions. No pneumothorax. No acute osseus abnormalities. IMPRESSION: Findings most worrisome for pulmonary edema with scattered areas of suspected atelectasis, though note, atypical infection could have a similar appearance. A follow-up chest radiograph in 3 to 4 weeks after treatment is recommended to ensure resolution. Electronically Signed   By: Sandi Mariscal M.D.   On: 07/05/2017 19:08   Ct Head Wo Contrast  Result Date: 07/05/2017 CLINICAL DATA:  Diaphoresis, encephalopathy. EXAM: CT HEAD WITHOUT CONTRAST TECHNIQUE: Contiguous axial images were obtained from the  base of the skull through the vertex without intravenous contrast. COMPARISON:  None. FINDINGS: Brain: No evidence of acute infarction, hemorrhage, hydrocephalus, extra-axial collection or mass lesion/mass effect. Mild age related involutional changes of the brain. Vascular: No hyperdense vessels. Skull: No acute fracture or suspicious osseous lesions. Sinuses/Orbits: Trace air-fluid level in the right maxillary sinus. Otherwise the paranasal sinuses are clear as are the mastoids. Intact orbits and globes. Other: None IMPRESSION: Mild age related involutional changes of the brain. No acute  intracranial abnormality. Trace fluid in the right maxillary sinus may represent mild sinusitis. Electronically Signed   By: Ashley Royalty M.D.   On: 07/05/2017 19:29    EKG:   Orders placed or performed during the hospital encounter of 07/05/17  . ED EKG  . ED EKG  . EKG 12-Lead  . EKG 12-Lead    IMPRESSION AND PLAN:  Principal Problem:   Acute hyponatremia -patient was placed on 3% saline in the ED at a low rate, we will admit him to stepdown unit for significant monitoring, goal to replenish his sodium 8-12 points over 24 hours Active Problems:   CAP (community acquired pneumonia) -IV antibiotics administered in the ED, we will continue these on admission.   HTN (hypertension) -continue home meds   Diabetes ([place]) -sliding scale insulin with corresponding glucose checks   HLD (hyperlipidemia) -Home dose antilipid   BPH (benign prostatic hyperplasia) -home dose Flomax   CKD (chronic kidney disease), stage III ([place]) -at baseline, monitor and avoid nephrotoxins  All the records are reviewed and case discussed with ED provider. Management plans discussed with the patient and/or family.  DVT PROPHYLAXIS: SubQ lovenox  GI PROPHYLAXIS: None  ADMISSION STATUS: Inpatient  CODE STATUS: Full Code Status History    This patient does not have a recorded code status. Please follow your organizational policy for patients in this situation.      TOTAL TIME TAKING CARE OF THIS PATIENT: 45 minutes.   Jannifer Franklin, Briannah Lona FIELDING 07/05/2017, 10:38 PM  [facility]  Office  [phone]  CC: Primary care physician; Leonel Ramsay, MD  Note:  This document was prepared using Dragon voice recognition software and may include unintentional dictation errors.

## 2017-07-05 NOTE — ED Triage Notes (Signed)
Pt had episode of spacing out and diaphoresis. Was incontinent. No hx seizures.  Skin clammy. Pt does not remember event. Disoriented to time.

## 2017-07-05 NOTE — ED Provider Notes (Addendum)
[facility] Emergency Department Provider Note  Time seen: 6:47 PM  I have reviewed the triage vital signs and the nursing notes.   HISTORY  Chief Complaint Altered Mental Status    HPI William Morse is a 84 y.o. male with a past medical history of diabetes, hypertension, hyperlipidemia, presents to the emergency department for increased weakness with altered mental status.  According to the son who lives with the patient he states over the past several days he has had increased weakness, and today has been having episodes of spacing out which his son describes he will stare off, not respond last 10 or 15 minutes and then he will become responsive once again.  Currently the patient is awake alert, oriented.  Mild tachypnea with a room air saturation of 87%.  No home O2 requirement.  Patient does have a frequent cough during exam.  Denies any chest pain.  Denies any known fever.  Past Medical History:  Diagnosis Date  . Diabetes mellitus without complication ([place])   . Hypercholesteremia   . Hypertension     There are no active problems to display for this patient.   History reviewed. No pertinent surgical history.  Prior to Admission medications   Not on File    No Known Allergies  History reviewed. No pertinent family history.  Social History Social History   Tobacco Use  . Smoking status: Former Research scientist (life sciences)  . Smokeless tobacco: Never Used  Substance Use Topics  . Alcohol use: Not on file  . Drug use: Not on file    Review of Systems Constitutional: Negative for fever. Eyes: Negative for visual complaints ENT: Negative for recent illness/congestion Cardiovascular: Negative for chest pain. Respiratory: Mild shortness of breath.  Positive for cough. Gastrointestinal: Negative for abdominal pain, vomiting Genitourinary: States normal urination, denies any retention. Musculoskeletal: Denies lower extremity swelling or pain Skin: Negative for  skin complaints  Neurological: Negative for headache All other ROS negative  ____________________________________________   PHYSICAL EXAM:  VITAL SIGNS: ED Triage Vitals  Enc Vitals Group     BP 07/05/17 1747 (!) 179/79     Pulse Rate 07/05/17 1748 66     Resp 07/05/17 1749 19     Temp --      Temp src --      SpO2 07/05/17 1748 (!) 87 %     Weight 07/05/17 1744 180 lb (81.6 kg)     Height 07/05/17 1744 5\' 8"  (1.727 m)     Head Circumference --      Peak Flow --      Pain Score --      Pain Loc --      Pain Edu? --      Excl. in [place]? --    Constitutional: Alert and oriented.  Mild tachypnea, mild respiratory distress. Eyes: Normal exam ENT   Head: Normocephalic and atraumatic.   Mouth/Throat: Mucous membranes are moist. Cardiovascular: Normal rate, regular rhythm.  Respiratory: Mild tachypnea, rhonchi greater in the right lung fields. Gastrointestinal: Soft and nontender. No distention. Musculoskeletal: Nontender with normal range of motion in all extremities.  Neurologic:  Normal speech and language. No gross focal neurologic deficits  Skin:  Skin is warm, dry and intact.  Psychiatric: Mood and affect are normal.  ____________________________________________    EKG  EKG reviewed and interpreted by myself shows a normal sinus rhythm at 65 bpm with a widened QRS, normal axis, normal intervals besides slight PR prolongation, ST changes  with mild ST depressions in the inferolateral leads.  Largely unchanged from prior 09/29/16.  ____________________________________________    RADIOLOGY  Chest x-ray shows significant edema versus atypical infection  ____________________________________________   INITIAL IMPRESSION / ASSESSMENT AND PLAN / ED COURSE  Pertinent labs & imaging results that were available during my care of the patient were reviewed by me and considered in my medical decision making (see chart for details).  Patient presents for episodes of  staring off into space and increased weakness over the past several days.  Also with increased respiratory rate and right-sided rhonchi.  Differential is quite broad but would include metabolic or left light abnormality, infectious etiology, pneumonia, CVA, urinary tract infection.  Patient's labs have begun to results, showing significant hyponatremia with a sodium of 107.  Troponin elevated 0.23.  Patient is hypoxic in the 80s on room air with no home O2 requirement.  We will check a chest x-ray as well as a CT scan of the head.  We will begin normal saline infusion for his hyponatremia with altered mental status.  Currently alert and oriented, with no apparent confusion.  No history of hyponatremia in the past.  Reviewing the patient's records he has a baseline elevated troponin likely unrelated to today's ER visit.  Chest x-ray shows significant edema versus atypical infection.  No fever.  Normal white blood cell count.  Patient is hyponatremic which would also be consistent with fluid overload, highly suspect pulmonary edema.  Given IV Lasix.  We will also cover with Zithromax for possible atypical infection.  Given the patient's significant hyponatremia with altered mental status we will start 3% hypertonic saline as well.  Patient will be admitted to the ICU.  Hospitalist aware of admission.  CRITICAL CARE Performed by: Harvest Dark   Total critical care time: 30 minutes  Critical care time was exclusive of separately billable procedures and treating other patients.  Critical care was necessary to treat or prevent imminent or life-threatening deterioration.  Critical care was time spent personally by me on the following activities: development of treatment plan with patient and/or surrogate as well as nursing, discussions with consultants, evaluation of patient's response to treatment, examination of patient, obtaining history from patient or surrogate, ordering and performing treatments  and interventions, ordering and review of laboratory studies, ordering and review of radiographic studies, pulse oximetry and re-evaluation of patient's condition.   ____________________________________________   FINAL CLINICAL IMPRESSION(S) / ED DIAGNOSES  Hyponatremia Altered mental status Elevated troponin Pulmonary edema   Harvest Dark, MD 07/05/17 Elige Radon, MD 07/05/17 2004

## 2017-07-05 NOTE — Consult Note (Signed)
Name: William Morse MRN: [id number] DOB: 12/13/1930    ADMISSION DATE:  07/05/2017 CONSULTATION DATE: 07/05/2017  REFERRING MD : Dr. Jannifer Franklin   CHIEF COMPLAINT: Altered Mental Status   BRIEF PATIENT DESCRIPTION:  84 yo male admitted with acute encephalopathy secondary to severe hyponatremia and acute hypoxic respiratory failure secondary to pulmonary edema vs. atypical infection  SIGNIFICANT EVENTS  01/23-Pt admitted to ICU   STUDIES:  CT Head 01/23>>Mild age related involutional changes of the brain. No acute intracranial abnormality. Trace fluid in the right maxillary sinus may represent mild sinusitis  HISTORY OF PRESENT ILLNESS:   This is an 84 yo male with a PMH of HTN, Hypercholesteremia, Diabetes Mellitus, Stage III CKD, and BPH.  He presented to [facility] ER 01/23 with increased weakness and altered mental status.  Per ER notes the pt lives with his son who stated the generalized weakness started several days ago, but today the pt had episodes of "spacing out and staring off" this lasted for about 10-15 minutes then the pt would respond spontaneously.  In the ER the pt was awake and alert/oriented.  He was mildly tachypneic with frequent cough and hypoxic with O2 sats 87% on RA he does not wear home O2.  Lab results revealed Na 107, BUN 37, creatinine 1.35, AST 141, troponin 0.23, glucose 204, and hgb 12.6.  CT Head negative for acute intracranial abnormality and CXR concerning for pulmonary edema vs. atypical infection.  Based on findings he received iv lasix, iv abx, and hypertonic saline started @50  ml/hr. The pt states he has been eating and drinking fluids although he has been nauseated with an episode of vomiting.  He was subsequently admitted to the stepdown unit by hospitalist team for further workup and treatment.   PAST MEDICAL HISTORY :   has a past medical history of BPH (benign prostatic hyperplasia), CKD (chronic kidney disease), stage III ([place]), Diabetes mellitus without  complication ([place]), Hypercholesteremia, and Hypertension.  has a past surgical history that includes Prostate surgery and ORIF Left Arm. Prior to Admission medications   Medication Sig Start Date End Date Taking? Authorizing Provider  amLODipine (NORVASC) 10 MG tablet Take 10 mg by mouth daily.   Yes [provider]  aspirin EC 81 MG tablet Take 81 mg by mouth daily.   Yes [provider]  lisinopril-hydrochlorothiazide (PRINZIDE,ZESTORETIC) 20-12.5 MG tablet Take 1 tablet by mouth 2 (two) times daily.   Yes [provider]  metoprolol succinate (TOPROL-XL) 100 MG 24 hr tablet Take 100 mg by mouth daily. Take with or immediately following a meal.   Yes [provider]  sertraline (ZOLOFT) 50 MG tablet Take 50 mg by mouth daily.   Yes [provider]  simvastatin (ZOCOR) 80 MG tablet Take 80 mg by mouth daily.   Yes [provider]  tamsulosin (FLOMAX) 0.4 MG CAPS capsule Take 0.4 mg by mouth daily.   Yes [provider]   No Known Allergies  FAMILY HISTORY:  family history includes CAD in his mother and sister; Cancer in his brother; Diabetes in his sister; Heart attack in his mother; Hypertension in his brother and sister; Seizures in his brother. SOCIAL HISTORY:  reports that he has quit smoking. he has never used smokeless tobacco. He reports that he does not drink alcohol or use drugs.  REVIEW OF SYSTEMS: Positives in BOLD  Constitutional: Negative for fever, chills, weight loss, malaise/fatigue and diaphoresis.  HENT: Negative for hearing loss, ear pain, nosebleeds, congestion,  sore throat, neck pain, tinnitus and ear discharge.   Eyes: Negative for blurred vision, double vision, photophobia, pain, discharge and redness.  Respiratory: cough, hemoptysis, sputum production, shortness of breath, wheezing and stridor.   Cardiovascular: chest pain, palpitations, orthopnea, claudication, leg swelling and PND.  Gastrointestinal:  heartburn, nausea, vomiting, abdominal pain, diarrhea, constipation, blood in stool and melena.  Genitourinary: Negative for dysuria, urgency, frequency, hematuria and flank pain.  Musculoskeletal: Negative for myalgias, back pain, joint pain and falls.  Skin: Negative for itching and rash.  Neurological: Negative for dizziness, tingling, tremors, sensory change, speech change, focal weakness, seizures, loss of consciousness, weakness and headaches.  Endo/Heme/Allergies: Negative for environmental allergies and polydipsia. Does not bruise/bleed easily.  SUBJECTIVE:  Pt c/o mild shortness of breath   VITAL SIGNS: Pulse Rate:  [60-66] 60 (01/23 2130) Resp:  [14-27] 27 (01/23 2130) BP: (133-189)/(73-116) 141/98 (01/23 2130) SpO2:  [87 %-97 %] 97 % (01/23 2130) Weight:  [81.6 kg (180 lb)] 81.6 kg (180 lb) (01/23 1744)  PHYSICAL EXAMINATION: General: well developed, well nourished male, NAD  Neuro: alert and oriented, follows commands, PERRLA  HEENT: supple, no JVD  Cardiovascular: sinus rhythm, no R/G Lungs: faint crackles throughout, mildly tachypneic  Abdomen: +BS x4, soft, obese, non tender, non distended  Musculoskeletal: trace bilateral lower extremity edema  Skin: intact no rashes or lesions present   Recent Labs  Lab 07/05/17 1742  NA 107*  K 4.3  CL 74*  CO2 22  BUN 37*  CREATININE 1.35*  GLUCOSE 204*   Recent Labs  Lab 07/05/17 1742  HGB 12.6*  HCT 35.1*  WBC 7.7  PLT 290   Dg Chest 2 View  Result Date: 07/05/2017 CLINICAL DATA:  Shortness of breath, dizziness and weakness. EXAM: CHEST  2 VIEW COMPARISON:  09/29/2016 FINDINGS: Grossly unchanged enlarged cardiac silhouette and mediastinal contours with atherosclerotic plaque within the thoracic aorta. The pulmonary vasculature appears indistinct with cephalization of flow. Worsening right mid and bilateral perihilar heterogeneous opacities. Trace bilateral effusions. No pneumothorax. No acute osseus abnormalities.  IMPRESSION: Findings most worrisome for pulmonary edema with scattered areas of suspected atelectasis, though note, atypical infection could have a similar appearance. A follow-up chest radiograph in 3 to 4 weeks after treatment is recommended to ensure resolution. Electronically Signed   By: Sandi Mariscal M.D.   On: 07/05/2017 19:08   Ct Head Wo Contrast  Result Date: 07/05/2017 CLINICAL DATA:  Diaphoresis, encephalopathy. EXAM: CT HEAD WITHOUT CONTRAST TECHNIQUE: Contiguous axial images were obtained from the base of the skull through the vertex without intravenous contrast. COMPARISON:  None. FINDINGS: Brain: No evidence of acute infarction, hemorrhage, hydrocephalus, extra-axial collection or mass lesion/mass effect. Mild age related involutional changes of the brain. Vascular: No hyperdense vessels. Skull: No acute fracture or suspicious osseous lesions. Sinuses/Orbits: Trace air-fluid level in the right maxillary sinus. Otherwise the paranasal sinuses are clear as are the mastoids. Intact orbits and globes. Other: None IMPRESSION: Mild age related involutional changes of the brain. No acute intracranial abnormality. Trace fluid in the right maxillary sinus may represent mild sinusitis. Electronically Signed   By: Ashley Royalty M.D.   On: 07/05/2017 19:29    ASSESSMENT / PLAN: Acute encephalopathy secondary to hyponatremia  Acute hypoxic respiratory failure secondary to pulmonary edema vs. atypical infection Acute on chronic renal failure Elevated troponin  Anemia without obvious acute blood loss  Hx: HTN and Diabetes Mellitus  P: Supplemental O2 for dyspnea and/or hypoxia  Repeat CXR in am  Continue 3% saline dosing per pharmacy  Nephrology consulted appreciate input  Serial Na levels  Trend BMP  Replace electrolytes as indicated  Monitor UOP  Serum and urine osmolality results pending  Frequent neuro checks and seizure precautions  Trend WBC and monitor fever curve Trend PCT Follow  cultures  Continue current abx Continuous telemetry monitoring  Trend troponin's  Lovenox for VTE prophylaxis  Trend CBC Monitor for s/sx of bleeding  Transfuse for hgb <7 SSI  Marda Stalker, [place] Pager [phone] (please enter 7 digits) PCCM Consult Pager [phone] (please enter 7 digits)

## 2017-07-05 NOTE — Progress Notes (Signed)
Pharmacy Antibiotic Note  William Morse is a 84 y.o. male admitted on 07/05/2017 with pneumonia.  Pharmacy has been consulted for ceftriaxone dosing.  Plan: Ceftriaxone 1 gram q 24 hours ordered.  Height: 5\' 8"  (172.7 cm) Weight: 180 lb (81.6 kg) IBW/kg (Calculated) : 68.4  No data recorded.  Recent Labs  Lab 07/05/17 1742  WBC 7.7  CREATININE 1.35*    Estimated Creatinine Clearance: 39.4 mL/min (A) (by C-G formula based on SCr of 1.35 mg/dL (H)).    No Known Allergies  Antimicrobials this admission: Ceftriaxone, azithromycin 1/23  >>    >>   Dose adjustments this admission:   Microbiology results: 1/23 BCx: pending      1/23 CXR: edema vs. atelectasis vs. atypical infection  Thank you for allowing pharmacy to be a part of this patient's care.  Trayden Brandy S 07/05/2017 11:18 PM

## 2017-07-05 NOTE — ED Notes (Signed)
Patient transported to X-ray 

## 2017-07-06 ENCOUNTER — Inpatient Hospital Stay: Payer: PPO

## 2017-07-06 ENCOUNTER — Other Ambulatory Visit: Payer: Self-pay

## 2017-07-06 ENCOUNTER — Inpatient Hospital Stay (HOSPITAL_COMMUNITY)
Admit: 2017-07-06 | Discharge: 2017-07-06 | Disposition: A | Discharge status: Home / Self Care | Payer: PPO | Attending: Internal Medicine | Admitting: Internal Medicine

## 2017-07-06 DIAGNOSIS — I340 Nonrheumatic mitral (valve) insufficiency: Secondary | ICD-10-CM

## 2017-07-06 LAB — OSMOLALITY
OSMOLALITY: 254 mosm/kg — AB (ref 275–295)
Osmolality: 244 mOsm/kg — CL (ref 275–295)

## 2017-07-06 LAB — CBC
HCT: 29.7 % — ABNORMAL LOW (ref 40.0–52.0)
HEMOGLOBIN: 10.7 g/dL — AB (ref 13.0–18.0)
MCH: 32.4 pg (ref 26.0–34.0)
MCHC: 36.1 g/dL — AB (ref 32.0–36.0)
MCV: 89.7 fL (ref 80.0–100.0)
Platelets: 245 10*3/uL (ref 150–440)
RBC: 3.31 MIL/uL — ABNORMAL LOW (ref 4.40–5.90)
RDW: 12.3 % (ref 11.5–14.5)
WBC: 7.7 10*3/uL (ref 3.8–10.6)

## 2017-07-06 LAB — GLUCOSE, CAPILLARY
GLUCOSE-CAPILLARY: 191 mg/dL — AB (ref 65–99)
GLUCOSE-CAPILLARY: 49 mg/dL — AB (ref 65–99)
GLUCOSE-CAPILLARY: 56 mg/dL — AB (ref 65–99)
Glucose-Capillary: 119 mg/dL — ABNORMAL HIGH (ref 65–99)
Glucose-Capillary: 206 mg/dL — ABNORMAL HIGH (ref 65–99)
Glucose-Capillary: 221 mg/dL — ABNORMAL HIGH (ref 65–99)
Glucose-Capillary: 259 mg/dL — ABNORMAL HIGH (ref 65–99)

## 2017-07-06 LAB — COMPREHENSIVE METABOLIC PANEL
ALK PHOS: 59 U/L (ref 38–126)
ALT: 50 U/L (ref 17–63)
ANION GAP: 11 (ref 5–15)
AST: 141 U/L — ABNORMAL HIGH (ref 15–41)
Albumin: 3.6 g/dL (ref 3.5–5.0)
BILIRUBIN TOTAL: 1 mg/dL (ref 0.3–1.2)
BUN: 37 mg/dL — ABNORMAL HIGH (ref 6–20)
CALCIUM: 7.9 mg/dL — AB (ref 8.9–10.3)
CO2: 22 mmol/L (ref 22–32)
Chloride: 74 mmol/L — ABNORMAL LOW (ref 101–111)
Creatinine, Ser: 1.35 mg/dL — ABNORMAL HIGH (ref 0.61–1.24)
GFR calc non Af Amer: 47 mL/min — ABNORMAL LOW (ref >60)
GFR, EST AFRICAN AMERICAN: 54 mL/min — AB (ref >60)
Glucose, Bld: 204 mg/dL — ABNORMAL HIGH (ref 65–99)
Potassium: 4.3 mmol/L (ref 3.5–5.1)
SODIUM: 107 mmol/L — AB (ref 135–145)
TOTAL PROTEIN: 7.2 g/dL (ref 6.5–8.1)

## 2017-07-06 LAB — BASIC METABOLIC PANEL
ANION GAP: 7 (ref 5–15)
BUN: 40 mg/dL — AB (ref 6–20)
CHLORIDE: 82 mmol/L — AB (ref 101–111)
CO2: 23 mmol/L (ref 22–32)
Calcium: 7.6 mg/dL — ABNORMAL LOW (ref 8.9–10.3)
Creatinine, Ser: 1.47 mg/dL — ABNORMAL HIGH (ref 0.61–1.24)
GFR calc Af Amer: 49 mL/min — ABNORMAL LOW (ref >60)
GFR calc non Af Amer: 42 mL/min — ABNORMAL LOW (ref >60)
GLUCOSE: 88 mg/dL (ref 65–99)
POTASSIUM: 3.7 mmol/L (ref 3.5–5.1)
Sodium: 112 mmol/L — CL (ref 135–145)

## 2017-07-06 LAB — OSMOLALITY, URINE
OSMOLALITY UR: 454 mosm/kg (ref 300–900)
Osmolality, Ur: 379 mOsm/kg (ref 300–900)

## 2017-07-06 LAB — TROPONIN I
TROPONIN I: 0.22 ng/mL — AB (ref <0.03)
Troponin I: 0.23 ng/mL (ref <0.03)
Troponin I: 0.22 ng/mL (ref <0.03)

## 2017-07-06 LAB — PROTEIN / CREATININE RATIO, URINE
Creatinine, Urine: 79 mg/dL
Protein Creatinine Ratio: 0.22 mg/mg{Cre} — ABNORMAL HIGH (ref 0.00–0.15)
Total Protein, Urine: 17 mg/dL

## 2017-07-06 LAB — SODIUM
SODIUM: 111 mmol/L — AB (ref 135–145)
SODIUM: 109 mmol/L — AB (ref 135–145)
Sodium: 113 mmol/L — CL (ref 135–145)
Sodium: 113 mmol/L — CL (ref 135–145)
Sodium: 116 mmol/L — CL (ref 135–145)

## 2017-07-06 LAB — INFLUENZA PANEL BY PCR (TYPE A & B)
Influenza A By PCR: NEGATIVE
Influenza B By PCR: NEGATIVE

## 2017-07-06 LAB — PROCALCITONIN: Procalcitonin: 0.46 ng/mL

## 2017-07-06 LAB — ECHOCARDIOGRAM COMPLETE
Height: 67 in
Weight: 2892.44 oz

## 2017-07-06 LAB — SODIUM, URINE, RANDOM
Sodium, Ur: 60 mmol/L
Sodium, Ur: 30 mmol/L

## 2017-07-06 LAB — BRAIN NATRIURETIC PEPTIDE: B Natriuretic Peptide: 573 pg/mL — ABNORMAL HIGH (ref 0.0–100.0)

## 2017-07-06 LAB — TSH: TSH: 0.485 u[IU]/mL (ref 0.350–4.500)

## 2017-07-06 LAB — CORTISOL: CORTISOL PLASMA: 27.1 ug/dL

## 2017-07-06 LAB — MRSA PCR SCREENING: MRSA BY PCR: NEGATIVE

## 2017-07-06 MED ORDER — DEXTROSE 5 % IV SOLN
500.0000 mg | INTRAVENOUS | Status: DC
  Administered 2017-07-06: 500 mg via INTRAVENOUS
  Filled 2017-07-06 (×2): qty 500

## 2017-07-06 MED ORDER — INSULIN ASPART 100 UNIT/ML ~~LOC~~ SOLN
0.0000 [IU] | Freq: Three times a day (TID) | SUBCUTANEOUS | Status: DC
  Administered 2017-07-06 – 2017-07-08 (×5): 3 [IU] via SUBCUTANEOUS
  Administered 2017-07-08: 2 [IU] via SUBCUTANEOUS
  Administered 2017-07-09: 3 [IU] via SUBCUTANEOUS
  Administered 2017-07-09: 2 [IU] via SUBCUTANEOUS
  Administered 2017-07-09 – 2017-07-10 (×4): 3 [IU] via SUBCUTANEOUS
  Administered 2017-07-11: 2 [IU] via SUBCUTANEOUS
  Administered 2017-07-11: 3 [IU] via SUBCUTANEOUS
  Administered 2017-07-11 – 2017-07-12 (×2): 2 [IU] via SUBCUTANEOUS
  Administered 2017-07-12 (×2): 3 [IU] via SUBCUTANEOUS
  Administered 2017-07-13: 1 [IU] via SUBCUTANEOUS
  Administered 2017-07-13 (×2): 3 [IU] via SUBCUTANEOUS
  Filled 2017-07-06 (×21): qty 1

## 2017-07-06 MED ORDER — DEXTROSE 50 % IV SOLN
25.0000 mL | Freq: Once | INTRAVENOUS | Status: AC
  Administered 2017-07-06: 25 mL via INTRAVENOUS

## 2017-07-06 MED ORDER — DEXTROSE 50 % IV SOLN
INTRAVENOUS | Status: AC
  Administered 2017-07-06: 25 mL via INTRAVENOUS
  Filled 2017-07-06: qty 50

## 2017-07-06 MED ORDER — TAMSULOSIN HCL 0.4 MG PO CAPS
0.4000 mg | ORAL_CAPSULE | Freq: Every day | ORAL | Status: DC
  Administered 2017-07-06 – 2017-07-13 (×8): 0.4 mg via ORAL
  Filled 2017-07-06 (×8): qty 1

## 2017-07-06 MED ORDER — BUDESONIDE 0.5 MG/2ML IN SUSP
0.5000 mg | Freq: Two times a day (BID) | RESPIRATORY_TRACT | Status: DC
  Administered 2017-07-06 – 2017-07-10 (×8): 0.5 mg via RESPIRATORY_TRACT
  Filled 2017-07-06 (×9): qty 2

## 2017-07-06 MED ORDER — SODIUM CHLORIDE 3 % IV SOLN
INTRAVENOUS | Status: DC
  Administered 2017-07-06: 40 mL/h via INTRAVENOUS
  Filled 2017-07-06 (×2): qty 500

## 2017-07-06 MED ORDER — ACETAMINOPHEN 325 MG PO TABS
650.0000 mg | ORAL_TABLET | Freq: Four times a day (QID) | ORAL | Status: DC | PRN
  Administered 2017-07-10: 650 mg via ORAL
  Filled 2017-07-06: qty 2

## 2017-07-06 MED ORDER — IPRATROPIUM-ALBUTEROL 0.5-2.5 (3) MG/3ML IN SOLN
3.0000 mL | RESPIRATORY_TRACT | Status: DC
  Administered 2017-07-06 – 2017-07-07 (×7): 3 mL via RESPIRATORY_TRACT
  Filled 2017-07-06 (×7): qty 3

## 2017-07-06 MED ORDER — ACETAMINOPHEN 650 MG RE SUPP: 650.0000 mg | Freq: Four times a day (QID) | RECTAL | Status: DC | PRN

## 2017-07-06 MED ORDER — ENOXAPARIN SODIUM 40 MG/0.4ML ~~LOC~~ SOLN
40.0000 mg | SUBCUTANEOUS | Status: DC
  Administered 2017-07-06 – 2017-07-12 (×7): 40 mg via SUBCUTANEOUS
  Filled 2017-07-06 (×7): qty 0.4

## 2017-07-06 MED ORDER — ONDANSETRON HCL 4 MG PO TABS: 4.0000 mg | ORAL_TABLET | Freq: Four times a day (QID) | ORAL | Status: DC | PRN

## 2017-07-06 MED ORDER — ASPIRIN EC 81 MG PO TBEC
81.0000 mg | DELAYED_RELEASE_TABLET | Freq: Every day | ORAL | Status: DC
  Administered 2017-07-06 – 2017-07-13 (×8): 81 mg via ORAL
  Filled 2017-07-06 (×8): qty 1

## 2017-07-06 MED ORDER — INSULIN ASPART 100 UNIT/ML ~~LOC~~ SOLN
0.0000 [IU] | Freq: Every day | SUBCUTANEOUS | Status: DC
  Administered 2017-07-06: 3 [IU] via SUBCUTANEOUS
  Administered 2017-07-08 – 2017-07-12 (×3): 2 [IU] via SUBCUTANEOUS
  Filled 2017-07-06 (×4): qty 1

## 2017-07-06 MED ORDER — SERTRALINE HCL 50 MG PO TABS
50.0000 mg | ORAL_TABLET | Freq: Every day | ORAL | Status: DC
  Administered 2017-07-06 – 2017-07-10 (×5): 50 mg via ORAL
  Filled 2017-07-06 (×5): qty 1

## 2017-07-06 MED ORDER — ATORVASTATIN CALCIUM 20 MG PO TABS
40.0000 mg | ORAL_TABLET | Freq: Every day | ORAL | Status: DC
  Administered 2017-07-06 – 2017-07-13 (×8): 40 mg via ORAL
  Filled 2017-07-06 (×7): qty 2

## 2017-07-06 MED ORDER — METOPROLOL SUCCINATE ER 50 MG PO TB24
100.0000 mg | ORAL_TABLET | Freq: Every day | ORAL | Status: DC
  Administered 2017-07-06 – 2017-07-09 (×4): 100 mg via ORAL
  Filled 2017-07-06 (×4): qty 2

## 2017-07-06 MED ORDER — BISACODYL 5 MG PO TBEC
5.0000 mg | DELAYED_RELEASE_TABLET | Freq: Every day | ORAL | Status: DC | PRN
  Administered 2017-07-07 – 2017-07-08 (×3): 5 mg via ORAL
  Filled 2017-07-06 (×3): qty 1

## 2017-07-06 MED ORDER — AMLODIPINE BESYLATE 10 MG PO TABS
10.0000 mg | ORAL_TABLET | Freq: Every day | ORAL | Status: DC
  Administered 2017-07-06 – 2017-07-08 (×3): 10 mg via ORAL
  Filled 2017-07-06 (×3): qty 1

## 2017-07-06 MED ORDER — ONDANSETRON HCL 4 MG/2ML IJ SOLN
4.0000 mg | Freq: Four times a day (QID) | INTRAMUSCULAR | Status: DC | PRN
  Administered 2017-07-10: 4 mg via INTRAVENOUS
  Filled 2017-07-06: qty 2

## 2017-07-06 NOTE — Plan of Care (Signed)
Pt has been alert this shift, oriented to self and place, however has been impulsive and will try to get out of bed without assistance or calling anyone. Pt ate breakfast without difficulty, but at lunch time was found laying on his back attempting to eat [place] with his hands; chewing meat made more difficult by not having his teeth. Son is to bring teeth, until then meats ordered to be chopped. Son and daughter in law currently at bedside helping pt eat dinner without difficulty. No complaints at present.

## 2017-07-06 NOTE — Progress Notes (Addendum)
MEDICATION RELATED CONSULT NOTE - INITIAL   Pharmacy Consult for hypertonic saline monitoring Indication: hyponatremia  No Known Allergies  Patient Measurements: Height: 5\' 7"  (170.2 cm) Weight: 180 lb 12.4 oz (82 kg) IBW/kg (Calculated) : 66.1 Adjusted Body Weight: n/a  Vital Signs: Temp: 97.9 F (36.6 C) (01/24 1600) Temp Source: Oral (01/24 1600) BP: 147/53 (01/24 1800) Pulse Rate: 71 (01/24 1800) Intake/Output from previous day: 01/23 0701 - 01/24 0700 In: 892.5 [I.V.:842.5; IV Piggyback:50] Out: 2001 [Urine:2000; Stool:1] Intake/Output from this shift: No intake/output data recorded.  Labs: Recent Labs    07/05/17 1742 07/06/17 0445 07/06/17 1002  WBC 7.7 7.7  --   HGB 12.6* 10.7*  --   HCT 35.1* 29.7*  --   PLT 290 245  --   CREATININE 1.35* 1.47*  --   LABCREA  --   --  79  ALBUMIN 3.6  --   --   PROT 7.2  --   --   AST 141*  --   --   ALT 50  --   --   ALKPHOS 59  --   --   BILITOT 1.0  --   --    Estimated Creatinine Clearance: 38.4 mL/min (A) (by C-G formula based on SCr of 1.47 mg/dL (H)).   Microbiology: Recent Results (from the past 720 hour(s))  Blood culture (routine x 2)     Status: None (Preliminary result)   Collection Time: 07/05/17  9:45 PM  Result Value Ref Range Status   Specimen Description BLOOD LT HAND  Final   Special Requests   Final    BOTTLES DRAWN AEROBIC AND ANAEROBIC Blood Culture adequate volume   Culture   Final    NO GROWTH < 12 HOURS Performed at [facility], [address] [place], [place] [address]    Report Status PENDING  Incomplete  Blood culture (routine x 2)     Status: None (Preliminary result)   Collection Time: 07/05/17  9:45 PM  Result Value Ref Range Status   Specimen Description BLOOD RT [facility]  Final   Special Requests   Final    BOTTLES DRAWN AEROBIC AND ANAEROBIC Blood Culture adequate volume   Culture   Final    NO GROWTH < 12 HOURS Performed at [facility], [address], [place], [place] [address]    Report Status PENDING  Incomplete  MRSA PCR Screening     Status: None   Collection Time: 07/06/17  7:16 AM  Result Value Ref Range Status   MRSA by PCR NEGATIVE NEGATIVE Final    Comment:        The GeneXpert MRSA Assay (FDA approved for NASAL specimens only), is one component of a comprehensive MRSA colonization surveillance program. It is not intended to diagnose MRSA infection nor to guide or monitor treatment for MRSA infections. Performed at [facility], [address], [place], [place] [address]     Medical History: Past Medical History:  Diagnosis Date  . BPH (benign prostatic hyperplasia)   . CKD (chronic kidney disease), stage III ([place])   . Diabetes mellitus without complication ([place])   . Hypercholesteremia   . Hypertension     Medications:  NaCl 3% at 50 mL/hr.  Assessment:  Pharmacy consulted for sodium monitoring in patient admitted with hyponatremia and started on hypertonic saline.   Na+  107 on admission (1/23 @107 )   Goal of Therapy:  Na Increase ~8 mmol/L in 24 hours  Plan:  Sodium  1/24  0045   109 mmol/L   1/24  0445   112 mmol/L                         1/24  1115   111 mmol/L   1/24  1510   113 mmol/L   1/24  1834   113 mmol/L                           1/24  2330   116 mmol/L rate dec to 20 mL/hr               1/25 0400    119  Hypertonic (3%) Saline rate increased to 45ml/hr per Dr. Holley Raring. Continue current rate. Continue to monitor sodium levels every 4 hours.   Laural Benes, PharmD, BCPS Clinical Pharmacist 07/06/2017 7:17 PM

## 2017-07-06 NOTE — Consult Note (Signed)
[facility] CONSULT NOTE    Date: 07/06/2017                  Patient Name:  William Morse  MRN: [id number]  DOB: May 14, 1931  Age / Sex: 84 y.o., male         PCP: Leonel Ramsay, MD                 Service Requesting Consult: Hospitalist                 Reason for Consult: Hyponatremia, CKD stage III            History of Present Illness: Patient is a 84 y.o. male with a PMHx of BPH, chronic kidney disease stage III, diabetes mellitus type 2, hypertension, hyperlipidemia, who was admitted to [facility] on 07/05/2017 for evaluation of altered mental status.  Patient is a poor historian and cannot offer significant history.  He was found to have a low sodium of 107 upon initial evaluation.  Na is up to 112 now.  Patient was noted to be on lisinopril/hydrochlorothiazide.  He also has history of chronic kidney disease stage III but is not following with a nephrologist at the moment.  In addition he has long-standing diabetes mellitus type 2 as well as hypertension.   Medications: Outpatient medications: Medications Prior to Admission  Medication Sig Dispense Refill Last Dose  . amLODipine (NORVASC) 10 MG tablet Take 10 mg by mouth daily.   07/05/2017 at AM  . aspirin EC 81 MG tablet Take 81 mg by mouth daily.   07/05/2017 at AM  . lisinopril-hydrochlorothiazide (PRINZIDE,ZESTORETIC) 20-12.5 MG tablet Take 1 tablet by mouth 2 (two) times daily.   07/05/2017 at AM  . metoprolol succinate (TOPROL-XL) 100 MG 24 hr tablet Take 100 mg by mouth daily. Take with or immediately following a meal.   07/05/2017 at AM  . sertraline (ZOLOFT) 50 MG tablet Take 50 mg by mouth daily.   07/05/2017 at AM  . simvastatin (ZOCOR) 80 MG tablet Take 80 mg by mouth daily.   07/04/2017 at PM  . tamsulosin (FLOMAX) 0.4 MG CAPS capsule Take 0.4 mg by mouth daily.   07/05/2017 at AM    Current medications: Current Facility-Administered Medications  Medication Dose Route Frequency Provider Last  Rate Last Dose  . acetaminophen (TYLENOL) tablet 650 mg  650 mg Oral Q6H PRN Lance Coon, MD       Or  . acetaminophen (TYLENOL) suppository 650 mg  650 mg Rectal Q6H PRN Lance Coon, MD      . amLODipine (NORVASC) tablet 10 mg  10 mg Oral Daily Lance Coon, MD      . aspirin EC tablet 81 mg  81 mg Oral Daily Lance Coon, MD      . atorvastatin (LIPITOR) tablet 40 mg  40 mg Oral q1800 Lance Coon, MD      . azithromycin (ZITHROMAX) 500 mg in dextrose 5 % 250 mL IVPB  500 mg Intravenous Q24H Lance Coon, MD      . cefTRIAXone (ROCEPHIN) 1 g in dextrose 5 % 50 mL IVPB  1 g Intravenous Q24H Lance Coon, MD      . enoxaparin (LOVENOX) injection 40 mg  40 mg Subcutaneous Q24H Lance Coon, MD      . insulin aspart (novoLOG) injection 0-5 Units  0-5 Units Subcutaneous QHS Lance Coon, MD      . insulin aspart (novoLOG) injection 0-9 Units  0-9 Units Subcutaneous TID WC Lance Coon, MD      . metoprolol succinate (TOPROL-XL) 24 hr tablet 100 mg  100 mg Oral Daily Lance Coon, MD      . ondansetron [facility]) tablet 4 mg  4 mg Oral Q6H PRN Lance Coon, MD       Or  . ondansetron [facility]) injection 4 mg  4 mg Intravenous Q6H PRN Lance Coon, MD      . sertraline (ZOLOFT) tablet 50 mg  50 mg Oral Daily Lance Coon, MD      . sodium chloride (hypertonic) 3 % solution   Intravenous Continuous Harvest Dark, MD 50 mL/hr at 07/06/17 0734 50 mL/hr at 07/06/17 0734  . tamsulosin (FLOMAX) capsule 0.4 mg  0.4 mg Oral Daily Lance Coon, MD          Allergies: No Known Allergies    Past Medical History: Past Medical History:  Diagnosis Date  . BPH (benign prostatic hyperplasia)   . CKD (chronic kidney disease), stage III ([place])   . Diabetes mellitus without complication ([place])   . Hypercholesteremia   . Hypertension      Past Surgical History: Past Surgical History:  Procedure Laterality Date  . ORIF Left Arm    . PROSTATE SURGERY       Family History: Family  History  Problem Relation Age of Onset  . Seizures Brother   . Cancer Brother   . Hypertension Brother   . CAD Mother   . Heart attack Mother   . Diabetes Sister   . Hypertension Sister   . CAD Sister      Social History: Social History   Socioeconomic History  . Marital status: Married    Spouse name: Not on file  . Number of children: Not on file  . Years of education: Not on file  . Highest education level: Not on file  Social Needs  . Financial resource strain: Not on file  . Food insecurity - worry: Not on file  . Food insecurity - inability: Not on file  . Transportation needs - medical: Not on file  . Transportation needs - non-medical: Not on file  Occupational History  . Not on file  Tobacco Use  . Smoking status: Former Research scientist (life sciences)  . Smokeless tobacco: Never Used  Substance and Sexual Activity  . Alcohol use: No    Frequency: Never  . Drug use: No  . Sexual activity: Not on file  Other Topics Concern  . Not on file  Social History Narrative  . Not on file     Review of Systems: Confused and cannot offer accurate review of systems.  Vital Signs: Blood pressure (!) 152/97, pulse (!) 59, temperature 98.1 F (36.7 C), temperature source Oral, resp. rate 14, height 5\' 7"  (1.702 m), weight 82 kg (180 lb 12.4 oz), SpO2 96 %.  Weight trends: Filed Weights   07/05/17 1744 07/06/17 0119  Weight: 81.6 kg (180 lb) 82 kg (180 lb 12.4 oz)    Physical Exam: General: NAD, laying in bed  Head: Normocephalic, atraumatic.  Eyes: Anicteric, EOMI  Nose: Mucous membranes moist, not inflammed, nonerythematous.  Throat: Oropharynx nonerythematous, no exudate appreciated.   Neck: Supple, trachea midline.  Lungs:  Normal respiratory effort. Clear to auscultation BL without crackles or wheezes.  Heart: RRR. S1 and S2 normal without gallop, murmur, or rubs.  Abdomen:  BS normoactive. Soft, Nondistended, non-tender.  No masses or organomegaly.  Extremities: trace  pretibial edema.  Neurologic: Awake, alert, confused  Skin: No visible rashes, scars.    Lab results: Basic Metabolic Panel: Recent Labs  Lab 07/05/17 1742 07/06/17 0047 07/06/17 0445  NA 107* 109* 112*  K 4.3  --  3.7  CL 74*  --  82*  CO2 22  --  23  GLUCOSE 204*  --  88  BUN 37*  --  40*  CREATININE 1.35*  --  1.47*  CALCIUM 7.9*  --  7.6*    Liver Function Tests: Recent Labs  Lab 07/05/17 1742  AST 141*  ALT 50  ALKPHOS 59  BILITOT 1.0  PROT 7.2  ALBUMIN 3.6   No results for input(s): LIPASE, AMYLASE in the last 168 hours. No results for input(s): AMMONIA in the last 168 hours.  CBC: Recent Labs  Lab 07/05/17 1742 07/06/17 0445  WBC 7.7 7.7  HGB 12.6* 10.7*  HCT 35.1* 29.7*  MCV 89.8 89.7  PLT 290 245    Cardiac Enzymes: Recent Labs  Lab 07/05/17 1742 07/06/17 0047 07/06/17 0445  TROPONINI 0.23* 0.23* 0.22*    BNP: Invalid input(s): POCBNP  CBG: Recent Labs  Lab 07/06/17 0700 07/06/17 0704 07/06/17 0721  GLUCAP 29* 5* 119*    Microbiology: Results for orders placed or performed during the hospital encounter of 07/05/17  Blood culture (routine x 2)     Status: None (Preliminary result)   Collection Time: 07/05/17  9:45 PM  Result Value Ref Range Status   Specimen Description BLOOD LT HAND  Final   Special Requests   Final    BOTTLES DRAWN AEROBIC AND ANAEROBIC Blood Culture adequate volume   Culture   Final    NO GROWTH < 12 HOURS Performed at [facility], [address] [place], [place] [address]    Report Status PENDING  Incomplete  Blood culture (routine x 2)     Status: None (Preliminary result)   Collection Time: 07/05/17  9:45 PM  Result Value Ref Range Status   Specimen Description BLOOD RT [facility]  Final   Special Requests   Final    BOTTLES DRAWN AEROBIC AND ANAEROBIC Blood Culture adequate volume   Culture   Final    NO GROWTH < 12 HOURS Performed at [facility], [address] [place],  [place] [address]    Report Status PENDING  Incomplete    Coagulation Studies: No results for input(s): LABPROT, INR in the last 72 hours.  Urinalysis: Recent Labs    07/05/17 1742  COLORURINE STRAW*  LABSPEC 1.010  PHURINE 5.0  GLUCOSEU NEGATIVE  HGBUR MODERATE*  BILIRUBINUR NEGATIVE  KETONESUR NEGATIVE  PROTEINUR 30*  NITRITE NEGATIVE  LEUKOCYTESUR NEGATIVE      Imaging: Dg Chest 2 View  Result Date: 07/05/2017 CLINICAL DATA:  Shortness of breath, dizziness and weakness. EXAM: CHEST  2 VIEW COMPARISON:  09/29/2016 FINDINGS: Grossly unchanged enlarged cardiac silhouette and mediastinal contours with atherosclerotic plaque within the thoracic aorta. The pulmonary vasculature appears indistinct with cephalization of flow. Worsening right mid and bilateral perihilar heterogeneous opacities. Trace bilateral effusions. No pneumothorax. No acute osseus abnormalities. IMPRESSION: Findings most worrisome for pulmonary edema with scattered areas of suspected atelectasis, though note, atypical infection could have a similar appearance. A follow-up chest radiograph in 3 to 4 weeks after treatment is recommended to ensure resolution. Electronically Signed   By: Sandi Mariscal M.D.   On: 07/05/2017 19:08   Ct Head Wo Contrast  Result Date: 07/05/2017 CLINICAL DATA:  Diaphoresis,  encephalopathy. EXAM: CT HEAD WITHOUT CONTRAST TECHNIQUE: Contiguous axial images were obtained from the base of the skull through the vertex without intravenous contrast. COMPARISON:  None. FINDINGS: Brain: No evidence of acute infarction, hemorrhage, hydrocephalus, extra-axial collection or mass lesion/mass effect. Mild age related involutional changes of the brain. Vascular: No hyperdense vessels. Skull: No acute fracture or suspicious osseous lesions. Sinuses/Orbits: Trace air-fluid level in the right maxillary sinus. Otherwise the paranasal sinuses are clear as are the mastoids. Intact orbits and globes. Other: None IMPRESSION:  Mild age related involutional changes of the brain. No acute intracranial abnormality. Trace fluid in the right maxillary sinus may represent mild sinusitis. Electronically Signed   By: Ashley Royalty M.D.   On: 07/05/2017 19:29      Assessment & Plan: Pt is a 84 y.o. male with a PMHx of BPH, chronic kidney disease stage III, diabetes mellitus type 2, hypertension, hyperlipidemia, who was admitted to [facility] on 07/05/2017 for evaluation of altered mental status.  1.  Hyponatremia, secondary to HCTZ most likely.  2.  CKD stage III. 3.  Hypertension. 4.  Diabetes mellitus type 2 with CKD.  Plan:  The patient was brought in for altered mental status.  This is most likely secondary to hyponatremia.  He was started on 3% saline.  We will lower the drip rate to 25 cc/h and continue to monitor serum sodium closely.  In addition he has underlying chronic kidney disease stage III and we will check SPEP, UPEP, ANA, and renal ultrasound.  He will need long-term follow-up for his underlying chronic kidney disease stage III.  Otherwise continue supportive care.  Thanks for consultation.

## 2017-07-06 NOTE — Progress Notes (Signed)
Pt has only had 225 ml in urine output today. Per pt, sometimes he only voids a few times a day, other he voids a lot. Stpoke with Burman Nieves, NP and verbal order received to bladder scan pt to make sure that he's not retaining. Bladder scan resulted 2100ml. No further orders at this time. Will continue to monitor.

## 2017-07-06 NOTE — Progress Notes (Signed)
[place] [facility] [place] NAME: William Morse    MR#:  [id number]  DATE OF BIRTH:  01/08/1931  SUBJECTIVE:  CHIEF COMPLAINT:   Chief Complaint  Patient presents with  . Altered Mental Status  mentation somewhat better per family  REVIEW OF SYSTEMS:  Review of Systems  Constitutional: Negative for chills, fever and weight loss.  HENT: Negative for nosebleeds and sore throat.   Eyes: Negative for blurred vision.  Respiratory: Negative for cough, shortness of breath and wheezing.   Cardiovascular: Negative for chest pain, orthopnea, leg swelling and PND.  Gastrointestinal: Negative for abdominal pain, constipation, diarrhea, heartburn, nausea and vomiting.  Genitourinary: Negative for dysuria and urgency.  Musculoskeletal: Negative for back pain.  Skin: Negative for rash.  Neurological: Negative for dizziness, speech change, focal weakness and headaches.  Endo/Heme/Allergies: Does not bruise/bleed easily.  Psychiatric/Behavioral: Negative for depression.    DRUG ALLERGIES:  No Known Allergies VITALS:  Blood pressure (!) 147/53, pulse 71, temperature 97.9 F (36.6 C), temperature source Oral, resp. rate 18, height 5\' 7"  (1.702 m), weight 82 kg (180 lb 12.4 oz), SpO2 91 %. PHYSICAL EXAMINATION:  Physical Exam  Constitutional: He is oriented to person, place, and time and well-developed, well-nourished, and in no distress.  HENT:  Head: Normocephalic and atraumatic.  Eyes: Conjunctivae and EOM are normal. Pupils are equal, round, and reactive to light.  Neck: Normal range of motion. Neck supple. No tracheal deviation present. No thyromegaly present.  Cardiovascular: Normal rate, regular rhythm and normal heart sounds.  Pulmonary/Chest: Effort normal and breath sounds normal. No respiratory distress. He has no wheezes. He exhibits no tenderness.  Abdominal: Soft. Bowel sounds are normal. He exhibits no distension. There is no tenderness.    Musculoskeletal: Normal range of motion.  Neurological: He is alert and oriented to person, place, and time. No cranial nerve deficit.  Skin: Skin is warm and dry. No rash noted.  Psychiatric: Mood and affect normal.   LABORATORY PANEL:  Male CBC Recent Labs  Lab 07/06/17 0445  WBC 7.7  HGB 10.7*  HCT 29.7*  PLT 245   ------------------------------------------------------------------------------------------------------------------ Chemistries  Recent Labs  Lab 07/05/17 1742  07/06/17 0445  07/06/17 1510  NA 107*   < > 112*   < > 113*  K 4.3  --  3.7  --   --   CL 74*  --  82*  --   --   CO2 22  --  23  --   --   GLUCOSE 204*  --  88  --   --   BUN 37*  --  40*  --   --   CREATININE 1.35*  --  1.47*  --   --   CALCIUM 7.9*  --  7.6*  --   --   AST 141*  --   --   --   --   ALT 50  --   --   --   --   ALKPHOS 59  --   --   --   --   BILITOT 1.0  --   --   --   --    < > = values in this interval not displayed.   RADIOLOGY:  Dg Chest 2 View  Result Date: 07/05/2017 CLINICAL DATA:  Shortness of breath, dizziness and weakness. EXAM: CHEST  2 VIEW COMPARISON:  09/29/2016 FINDINGS: Grossly unchanged enlarged cardiac silhouette and mediastinal contours with atherosclerotic  plaque within the thoracic aorta. The pulmonary vasculature appears indistinct with cephalization of flow. Worsening right mid and bilateral perihilar heterogeneous opacities. Trace bilateral effusions. No pneumothorax. No acute osseus abnormalities. IMPRESSION: Findings most worrisome for pulmonary edema with scattered areas of suspected atelectasis, though note, atypical infection could have a similar appearance. A follow-up chest radiograph in 3 to 4 weeks after treatment is recommended to ensure resolution. Electronically Signed   By: Sandi Mariscal M.D.   On: 07/05/2017 19:08   Ct Head Wo Contrast  Result Date: 07/05/2017 CLINICAL DATA:  Diaphoresis, encephalopathy. EXAM: CT HEAD WITHOUT CONTRAST TECHNIQUE:  Contiguous axial images were obtained from the base of the skull through the vertex without intravenous contrast. COMPARISON:  None. FINDINGS: Brain: No evidence of acute infarction, hemorrhage, hydrocephalus, extra-axial collection or mass lesion/mass effect. Mild age related involutional changes of the brain. Vascular: No hyperdense vessels. Skull: No acute fracture or suspicious osseous lesions. Sinuses/Orbits: Trace air-fluid level in the right maxillary sinus. Otherwise the paranasal sinuses are clear as are the mastoids. Intact orbits and globes. Other: None IMPRESSION: Mild age related involutional changes of the brain. No acute intracranial abnormality. Trace fluid in the right maxillary sinus may represent mild sinusitis. Electronically Signed   By: Ashley Royalty M.D.   On: 07/05/2017 19:29   US Renal  Result Date: 07/06/2017 CLINICAL DATA:  Acute renal failure EXAM: RENAL / URINARY TRACT ULTRASOUND COMPLETE COMPARISON:  None in PACs FINDINGS: Right Kidney: Length: 10.1 cm. The renal cortical echotexture is approximately equal to that of the adjacent liver. There is no hydronephrosis. Left Kidney: Length: 11.8 cm. The renal cortical echotexture is similar to that on the right. There is no hydronephrosis. Bladder: Appears normal for degree of bladder distention. IMPRESSION: No hydronephrosis. No acute abnormality of either kidney. Normal appearing urinary bladder. Electronically Signed   By: David  Martinique M.D.   On: 07/06/2017 10:04   ASSESSMENT AND PLAN:  84 y.o. male with a PMHx of BPH, chronic kidney disease stage III, diabetes mellitus type 2, hypertension, hyperlipidemia, who was admitted to [facility] on 07/05/2017 for evaluation of altered mental status  * Acute hyponatremia - likely secondary to HCTZ  -patient was placed on 3% saline in the ED at a low rate,  goal to replenish his sodium 8-12 points over 24 hours - Nephro  lowered the drip rate to 25 cc/h and continue to monitor serum sodium  closely    CAP (community acquired pneumonia) -continue Abx   HTN (hypertension) -continue home meds   Diabetes ([place]) -sliding scale insulin with corresponding glucose checks   HLD (hyperlipidemia) -Home dose antilipid   BPH (benign prostatic hyperplasia) -home dose Flomax   CKD (chronic kidney disease), stage III ([place]) -at baseline, monitor and avoid nephrotoxins        All the records are reviewed and case discussed with Care Management/Social Worker. Management plans discussed with the patient, family and they are in agreement.  CODE STATUS: Full Code  TOTAL TIME TAKING CARE OF THIS PATIENT: 35 minutes.   More than 50% of the time was spent in counseling/coordination of care: YES  POSSIBLE D/C IN 2-3 DAYS, DEPENDING ON CLINICAL CONDITION.   Max Sane M.D on 07/06/2017 at 6:24 PM  Between 7am to 6pm - Pager - [phone]  After 6pm go to [email or web address] - password [email or web address] [facility]  [facility]  Office  [phone]  CC: Primary care physician; Leonel Ramsay, MD  Note: This dictation was prepared with  Dragon dictation along with smaller Company secretary. Any transcriptional errors that result from this process are unintentional.

## 2017-07-06 NOTE — Progress Notes (Signed)
AM glucose 56. D50 given per orders. Pnt also given orange juice. Pnt tol well. Pnt easily awakened no issues or concerns. Shift report given to oncoming RN. Blood glucose recheck in 15 minutes.

## 2017-07-06 NOTE — Progress Notes (Signed)
MEDICATION RELATED CONSULT NOTE - INITIAL   Pharmacy Consult for hypertonic saline monitoring Indication: hyponatremia  No Known Allergies  Patient Measurements: Height: 5\' 7"  (170.2 cm) Weight: 180 lb 12.4 oz (82 kg) IBW/kg (Calculated) : 66.1 Adjusted Body Weight: n/a  Vital Signs: Temp: 98.1 F (36.7 C) (01/24 0800) Temp Source: Oral (01/24 0800) BP: 172/87 (01/24 0919) Pulse Rate: 68 (01/24 0919) Intake/Output from previous day: 01/23 0701 - 01/24 0700 In: 892.5 [I.V.:842.5; IV Piggyback:50] Out: 2001 [Urine:2000; Stool:1] Intake/Output from this shift: No intake/output data recorded.  Labs: Recent Labs    07/05/17 1742 07/06/17 0445 07/06/17 1002  WBC 7.7 7.7  --   HGB 12.6* 10.7*  --   HCT 35.1* 29.7*  --   PLT 290 245  --   CREATININE 1.35* 1.47*  --   LABCREA  --   --  79  ALBUMIN 3.6  --   --   PROT 7.2  --   --   AST 141*  --   --   ALT 50  --   --   ALKPHOS 59  --   --   BILITOT 1.0  --   --    Estimated Creatinine Clearance: 38.4 mL/min (A) (by C-G formula based on SCr of 1.47 mg/dL (H)).   Microbiology: Recent Results (from the past 720 hour(s))  Blood culture (routine x 2)     Status: None (Preliminary result)   Collection Time: 07/05/17  9:45 PM  Result Value Ref Range Status   Specimen Description BLOOD LT HAND  Final   Special Requests   Final    BOTTLES DRAWN AEROBIC AND ANAEROBIC Blood Culture adequate volume   Culture   Final    NO GROWTH < 12 HOURS Performed at [facility], [address], [place], [place] [address]    Report Status PENDING  Incomplete  Blood culture (routine x 2)     Status: None (Preliminary result)   Collection Time: 07/05/17  9:45 PM  Result Value Ref Range Status   Specimen Description BLOOD RT [facility]  Final   Special Requests   Final    BOTTLES DRAWN AEROBIC AND ANAEROBIC Blood Culture adequate volume   Culture   Final    NO GROWTH < 12 HOURS Performed at [facility], [address], [place], [place] [address]    Report Status PENDING  Incomplete  MRSA PCR Screening     Status: None   Collection Time: 07/06/17  7:16 AM  Result Value Ref Range Status   MRSA by PCR NEGATIVE NEGATIVE Final    Comment:        The GeneXpert MRSA Assay (FDA approved for NASAL specimens only), is one component of a comprehensive MRSA colonization surveillance program. It is not intended to diagnose MRSA infection nor to guide or monitor treatment for MRSA infections. Performed at [facility], [address], [place], [place] [address]     Medical History: Past Medical History:  Diagnosis Date  . BPH (benign prostatic hyperplasia)   . CKD (chronic kidney disease), stage III ([place])   . Diabetes mellitus without complication ([place])   . Hypercholesteremia   . Hypertension     Medications:  NaCl 3% at 50 mL/hr.  Assessment:  Pharmacy consulted for sodium monitoring in patient admitted with hyponatremia and started on hypertonic saline.   Na+  107 on admission (1/23 @107 )   Goal of Therapy:  Na Increase ~8 mmol/L in 24 hours  Plan:  Sodium  1/24  0045   109 mmol/L   1/24  0445   112 mmol/L                         1/24  1115   111 mmol/L    Hypertonic (2%) Saline rate increased to 62ml/hr per Dr. Holley Raring.  Continue to monitor sodium levels every 4 hours.   Pernell Dupre, PharmD, BCPS Clinical Pharmacist 07/06/2017 12:36 PM

## 2017-07-06 NOTE — Progress Notes (Signed)
MEDICATION RELATED CONSULT NOTE - INITIAL   Pharmacy Consult for hypertonic saline monitoring Indication: hyponatremia  No Known Allergies  Patient Measurements: Height: 5\' 7"  (170.2 cm) Weight: 180 lb 12.4 oz (82 kg) IBW/kg (Calculated) : 66.1 Adjusted Body Weight: n/a  Vital Signs: Temp: 98.3 F (36.8 C) (01/24 0119) Temp Source: Oral (01/24 0119) BP: 151/69 (01/24 0100) Pulse Rate: 61 (01/24 0100) Intake/Output from previous day: 01/23 0701 - 01/24 0700 In: 500 [I.V.:450; IV Piggyback:50] Out: 3893 [Urine:1800; Stool:1] Intake/Output from this shift: Total I/O In: 500 [I.V.:450; IV Piggyback:50] Out: 1801 [Urine:1800; Stool:1]  Labs: Recent Labs    07/05/17 1742  WBC 7.7  HGB 12.6*  HCT 35.1*  PLT 290  CREATININE 1.35*  ALBUMIN 3.6  PROT 7.2  AST 141*  ALT 50  ALKPHOS 59  BILITOT 1.0   Estimated Creatinine Clearance: 41.8 mL/min (A) (by C-G formula based on SCr of 1.35 mg/dL (H)).   Microbiology: No results found for this or any previous visit (from the past 720 hour(s)).  Medical History: Past Medical History:  Diagnosis Date  . BPH (benign prostatic hyperplasia)   . CKD (chronic kidney disease), stage III ([place])   . Diabetes mellitus without complication ([place])   . Hypercholesteremia   . Hypertension     Medications:  NaCl 3% at 50 mL/hr.  Assessment: Na+ 107 on admission  1/24 0045   109 mmol/L   Goal of Therapy:    Plan:  Continue with current regimen. Recheck sodium at next interval.  Sim Boast, PharmD, BCPS  07/06/17 2:43 AM      Jazlynne Milliner S 07/06/2017,2:40 AM

## 2017-07-06 NOTE — Progress Notes (Signed)
*  PRELIMINARY RESULTS* Echocardiogram 2D Echocardiogram has been performed.  William Morse 07/06/2017, 2:02 PM

## 2017-07-07 DIAGNOSIS — E871 Hypo-osmolality and hyponatremia: Secondary | ICD-10-CM

## 2017-07-07 LAB — SODIUM
SODIUM: 119 mmol/L — AB (ref 135–145)
SODIUM: 117 mmol/L — AB (ref 135–145)
Sodium: 118 mmol/L — CL (ref 135–145)
Sodium: 120 mmol/L — ABNORMAL LOW (ref 135–145)

## 2017-07-07 LAB — GLUCOSE, CAPILLARY
GLUCOSE-CAPILLARY: 231 mg/dL — AB (ref 65–99)
Glucose-Capillary: 96 mg/dL (ref 65–99)
Glucose-Capillary: 205 mg/dL — ABNORMAL HIGH (ref 65–99)
Glucose-Capillary: 150 mg/dL — ABNORMAL HIGH (ref 65–99)

## 2017-07-07 LAB — PROTEIN ELECTROPHORESIS, SERUM
A/G RATIO SPE: 1 (ref 0.7–1.7)
ALBUMIN ELP: 3 g/dL (ref 2.9–4.4)
ALPHA-1-GLOBULIN: 0.3 g/dL (ref 0.0–0.4)
Alpha-2-Globulin: 0.8 g/dL (ref 0.4–1.0)
BETA GLOBULIN: 1.1 g/dL (ref 0.7–1.3)
GAMMA GLOBULIN: 0.9 g/dL (ref 0.4–1.8)
Globulin, Total: 3 g/dL (ref 2.2–3.9)
Total Protein ELP: 6 g/dL (ref 6.0–8.5)

## 2017-07-07 LAB — PROTEIN ELECTRO, RANDOM URINE
ALPHA-1-GLOBULIN, U: 3.1 %
ALPHA-2-GLOBULIN, U: 4.9 %
Albumin ELP, Urine: 72.7 %
Beta Globulin, U: 9.6 %
Gamma Globulin, U: 9.7 %
Total Protein, Urine: 19.2 mg/dL

## 2017-07-07 LAB — ANA W/REFLEX IF POSITIVE: Anti Nuclear Antibody(ANA): NEGATIVE

## 2017-07-07 MED ORDER — IPRATROPIUM-ALBUTEROL 0.5-2.5 (3) MG/3ML IN SOLN
3.0000 mL | Freq: Four times a day (QID) | RESPIRATORY_TRACT | Status: DC
  Administered 2017-07-07 – 2017-07-10 (×10): 3 mL via RESPIRATORY_TRACT
  Filled 2017-07-07 (×10): qty 3

## 2017-07-07 MED ORDER — SODIUM CHLORIDE 3 % IV SOLN
INTRAVENOUS | Status: DC
  Administered 2017-07-07: 25 mL/h via INTRAVENOUS
  Administered 2017-07-08: 45 mL/h via INTRAVENOUS
  Administered 2017-07-08: 40 mL/h via INTRAVENOUS
  Administered 2017-07-09: 45 mL/h via INTRAVENOUS
  Filled 2017-07-07 (×5): qty 500

## 2017-07-07 MED ORDER — SIMETHICONE 80 MG PO CHEW
80.0000 mg | CHEWABLE_TABLET | Freq: Four times a day (QID) | ORAL | Status: DC | PRN
  Administered 2017-07-08 – 2017-07-10 (×2): 80 mg via ORAL
  Filled 2017-07-07 (×3): qty 1

## 2017-07-07 MED ORDER — POLYETHYLENE GLYCOL 3350 17 G PO PACK
17.0000 g | PACK | Freq: Every day | ORAL | Status: DC
  Administered 2017-07-07 – 2017-07-09 (×3): 17 g via ORAL
  Filled 2017-07-07 (×3): qty 1

## 2017-07-07 MED ORDER — AZITHROMYCIN 500 MG PO TABS
500.0000 mg | ORAL_TABLET | Freq: Every day | ORAL | Status: AC
  Administered 2017-07-07 – 2017-07-08 (×2): 500 mg via ORAL
  Filled 2017-07-07 (×2): qty 1

## 2017-07-07 NOTE — Progress Notes (Signed)
[facility] NAME: William Morse    MR#:  [id number]  DATE OF BIRTH:  03-22-1931  SUBJECTIVE:  CHIEF COMPLAINT:   Chief Complaint  Patient presents with  . Altered Mental Status  mentation improving per family, Na 118 REVIEW OF SYSTEMS:  Review of Systems  Constitutional: Negative for chills, fever and weight loss.  HENT: Negative for nosebleeds and sore throat.   Eyes: Negative for blurred vision.  Respiratory: Negative for cough, shortness of breath and wheezing.   Cardiovascular: Negative for chest pain, orthopnea, leg swelling and PND.  Gastrointestinal: Negative for abdominal pain, constipation, diarrhea, heartburn, nausea and vomiting.  Genitourinary: Negative for dysuria and urgency.  Musculoskeletal: Negative for back pain.  Skin: Negative for rash.  Neurological: Negative for dizziness, speech change, focal weakness and headaches.  Endo/Heme/Allergies: Does not bruise/bleed easily.  Psychiatric/Behavioral: Negative for depression.    DRUG ALLERGIES:  No Known Allergies VITALS:  Blood pressure (!) 115/59, pulse 70, temperature 99 F (37.2 C), temperature source Oral, resp. rate 16, height 5\' 7"  (1.702 m), weight 82 kg (180 lb 12.4 oz), SpO2 96 %. PHYSICAL EXAMINATION:  Physical Exam  Constitutional: He is oriented to person, place, and time and well-developed, well-nourished, and in no distress.  HENT:  Head: Normocephalic and atraumatic.  Eyes: Conjunctivae and EOM are normal. Pupils are equal, round, and reactive to light.  Neck: Normal range of motion. Neck supple. No tracheal deviation present. No thyromegaly present.  Cardiovascular: Normal rate, regular rhythm and normal heart sounds.  Pulmonary/Chest: Effort normal and breath sounds normal. No respiratory distress. He has no wheezes. He exhibits no tenderness.  Abdominal: Soft. Bowel sounds are normal. He exhibits no distension. There is no tenderness.    Musculoskeletal: Normal range of motion.  Neurological: He is alert and oriented to person, place, and time. No cranial nerve deficit.  Skin: Skin is warm and dry. No rash noted.  Psychiatric: Mood and affect normal.   LABORATORY PANEL:  Male CBC Recent Labs  Lab 07/06/17 0445  WBC 7.7  HGB 10.7*  HCT 29.7*  PLT 245   ------------------------------------------------------------------------------------------------------------------ Chemistries  Recent Labs  Lab 07/05/17 1742  07/06/17 0445  07/07/17 1546  NA 107*   < > 112*   < > 118*  K 4.3  --  3.7  --   --   CL 74*  --  82*  --   --   CO2 22  --  23  --   --   GLUCOSE 204*  --  88  --   --   BUN 37*  --  40*  --   --   CREATININE 1.35*  --  1.47*  --   --   CALCIUM 7.9*  --  7.6*  --   --   AST 141*  --   --   --   --   ALT 50  --   --   --   --   ALKPHOS 59  --   --   --   --   BILITOT 1.0  --   --   --   --    < > = values in this interval not displayed.   RADIOLOGY:  Dg Abd 1 View  Result Date: 07/07/2017 CLINICAL DATA:  Abdominal distention EXAM: ABDOMEN - 1 VIEW COMPARISON:  None. FINDINGS: Colon is diffusely gas-filled without significant distention. Gas-filled nondistended stomach. No small bowel distention.  Moderate stool in the rectum. Changes most likely to represent ileus. No radiopaque stones. Degenerative changes in the spine. Vascular calcifications. IMPRESSION: Gas-filled nondistended colon likely to represent ileus. No findings suggesting obstruction. Electronically Signed   By: Lucienne Capers M.D.   On: 07/07/2017 00:27   ASSESSMENT AND PLAN:  84 y.o. male with a PMHx of BPH, chronic kidney disease stage III, diabetes mellitus type 2, hypertension, hyperlipidemia, who was admitted to [facility] on 07/05/2017 for evaluation of altered mental status  * Acute hyponatremia - likely secondary to HCTZ  - Na 118 now. Nephro reinitiate 3% saline at 25 cc/h.  Continue to monitor serum sodium closely.    *  CAP  (community acquired pneumonia) -continue Rocephin + Zithromax   * HTN (hypertension) -continue Norvasc, Metoprolol  * Diabetes (HCC) -sliding scale insulin with corresponding glucose checks  * HLD (hyperlipidemia) - continue lipitor * BPH (benign prostatic hyperplasia) -continue Flomax * CKD (chronic kidney disease), stage III ([place]) -at baseline, monitor and avoid nephrotoxins        All the records are reviewed and case discussed with Care Management/Social Worker. Management plans discussed with the patient, family and they are in agreement.  CODE STATUS: Full Code  TOTAL TIME TAKING CARE OF THIS PATIENT: 15 minutes.   More than 50% of the time was spent in counseling/coordination of care: YES  POSSIBLE D/C IN 2-3 DAYS, DEPENDING ON CLINICAL CONDITION.   Max Sane M.D on 07/07/2017 at 8:05 PM  Between 7am to 6pm - Pager - [phone]  After 6pm go to [email or web address] - Proofreader  [facility]  Office  [phone]  CC: Primary care physician; Leonel Ramsay, MD  Note: This dictation was prepared with Dragon dictation along with smaller phrase technology. Any transcriptional errors that result from this process are unintentional.

## 2017-07-07 NOTE — Progress Notes (Signed)
Pharmacy Antibiotic Note  William Morse is a 84 y.o. male admitted on 07/05/2017 with pneumonia.  Pharmacy has been consulted for ceftriaxone dosing. Patient is also receiving Azithromycin.   Plan: Ceftriaxone 1 gram q 24 hours ordered.  Height: 5\' 7"  (170.2 cm) Weight: 180 lb 12.4 oz (82 kg) IBW/kg (Calculated) : 66.1  Temp (24hrs), Avg:98.1 F (36.7 C), Min:97.4 F (36.3 C), Max:98.7 F (37.1 C)  Recent Labs  Lab 07/05/17 1742 07/06/17 0445  WBC 7.7 7.7  CREATININE 1.35* 1.47*    Estimated Creatinine Clearance: 38.4 mL/min (A) (by C-G formula based on SCr of 1.47 mg/dL (H)).    No Known Allergies  Antimicrobials this admission: Ceftriaxone 1/23 azithromycin 1/23  >> 1/26  Dose adjustments this admission:  Microbiology results: 1/23 BCx: NG TD 1/24 MRSA PCR (-)   1/23 CXR: edema vs. atelectasis vs. atypical infection  Thank you for allowing pharmacy to be a part of this patient's care.  Pernell Dupre, PharmD, BCPS Clinical Pharmacist 07/07/2017 11:16 AM

## 2017-07-07 NOTE — Progress Notes (Signed)
Pt requested lab to return to draw blood after he has BM.

## 2017-07-07 NOTE — Progress Notes (Signed)
[facility]  ROUNDING NOTE   Subjective:  Patient has significantly improved. He is currently awake, alert, and oriented x3. Eating breakfast at the moment. Serum sodium up to 119 this a.m.   Objective:  Vital signs in last 24 hours:  Temp:  [97.4 F (36.3 C)-98.7 F (37.1 C)] 98.7 F (37.1 C) (01/25 0200) Pulse Rate:  [61-92] 92 (01/25 0852) Resp:  [12-31] 12 (01/25 0600) BP: (103-180)/(48-87) 138/48 (01/25 0852) SpO2:  [90 %-99 %] 90 % (01/25 0738) FiO2 (%):  [28 %-36 %] 28 % (01/25 0334)  Weight change:  Filed Weights   07/05/17 1744 07/06/17 0119  Weight: 81.6 kg (180 lb) 82 kg (180 lb 12.4 oz)    Intake/Output: I/O last 3 completed shifts: In: 2003.3 [I.V.:1653.3; IV Piggyback:350] Out: 3096 [XVQMG:8676; Stool:1]   Intake/Output this shift:  No intake/output data recorded.  Physical Exam: General: No acute distress  Head: Normocephalic, atraumatic. Moist oral mucosal membranes  Eyes: Anicteric  Neck: Supple, trachea midline  Lungs:  Clear to auscultation, normal effort  Heart: S1S2 no rubs  Abdomen:  Soft, nontender, bowel sounds present  Extremities: Trace peripheral edema.  Neurologic: Awake, alert, following commands  Skin: No lesions       Basic Metabolic Panel: Recent Labs  Lab 07/05/17 1742  07/06/17 0445 07/06/17 1115 07/06/17 1510 07/06/17 1834 07/06/17 2323 07/07/17 0411  NA 107*   < > 112* 111* 113* 113* 116* 119*  K 4.3  --  3.7  --   --   --   --   --   CL 74*  --  82*  --   --   --   --   --   CO2 22  --  23  --   --   --   --   --   GLUCOSE 204*  --  88  --   --   --   --   --   BUN 37*  --  40*  --   --   --   --   --   CREATININE 1.35*  --  1.47*  --   --   --   --   --   CALCIUM 7.9*  --  7.6*  --   --   --   --   --    < > = values in this interval not displayed.    Liver Function Tests: Recent Labs  Lab 07/05/17 1742  AST 141*  ALT 50  ALKPHOS 59  BILITOT 1.0  PROT 7.2  ALBUMIN 3.6   No results for  input(s): LIPASE, AMYLASE in the last 168 hours. No results for input(s): AMMONIA in the last 168 hours.  CBC: Recent Labs  Lab 07/05/17 1742 07/06/17 0445  WBC 7.7 7.7  HGB 12.6* 10.7*  HCT 35.1* 29.7*  MCV 89.8 89.7  PLT 290 245    Cardiac Enzymes: Recent Labs  Lab 07/05/17 1742 07/06/17 0047 07/06/17 0445 07/06/17 1247  TROPONINI 0.23* 0.23* 0.22* 0.22*    BNP: Invalid input(s): POCBNP  CBG: Recent Labs  Lab 07/06/17 0721 07/06/17 1100 07/06/17 1606 07/06/17 2204 07/07/17 0759  GLUCAP 119* 206* 221* 259* 96    Microbiology: Results for orders placed or performed during the hospital encounter of 07/05/17  Blood culture (routine x 2)     Status: None (Preliminary result)   Collection Time: 07/05/17  9:45 PM  Result Value Ref Range Status   Specimen Description BLOOD  LT HAND  Final   Special Requests   Final    BOTTLES DRAWN AEROBIC AND ANAEROBIC Blood Culture adequate volume   Culture   Final    NO GROWTH 2 DAYS Performed at [facility], [address] [place], [place] [address]    Report Status PENDING  Incomplete  Blood culture (routine x 2)     Status: None (Preliminary result)   Collection Time: 07/05/17  9:45 PM  Result Value Ref Range Status   Specimen Description BLOOD RT [facility]  Final   Special Requests   Final    BOTTLES DRAWN AEROBIC AND ANAEROBIC Blood Culture adequate volume   Culture   Final    NO GROWTH 2 DAYS Performed at [facility], [address], [place], [place] [address]    Report Status PENDING  Incomplete  MRSA PCR Screening     Status: None   Collection Time: 07/06/17  7:16 AM  Result Value Ref Range Status   MRSA by PCR NEGATIVE NEGATIVE Final    Comment:        The GeneXpert MRSA Assay (FDA approved for NASAL specimens only), is one component of a comprehensive MRSA colonization surveillance program. It is not intended to diagnose MRSA infection nor to guide or monitor treatment for MRSA  infections. Performed at [facility], [address] [place], [place] [address]     Coagulation Studies: No results for input(s): LABPROT, INR in the last 72 hours.  Urinalysis: Recent Labs    07/05/17 1742  COLORURINE STRAW*  LABSPEC 1.010  PHURINE 5.0  GLUCOSEU NEGATIVE  HGBUR MODERATE*  BILIRUBINUR NEGATIVE  KETONESUR NEGATIVE  PROTEINUR 30*  NITRITE NEGATIVE  LEUKOCYTESUR NEGATIVE      Imaging: Dg Chest 2 View  Result Date: 07/05/2017 CLINICAL DATA:  Shortness of breath, dizziness and weakness. EXAM: CHEST  2 VIEW COMPARISON:  09/29/2016 FINDINGS: Grossly unchanged enlarged cardiac silhouette and mediastinal contours with atherosclerotic plaque within the thoracic aorta. The pulmonary vasculature appears indistinct with cephalization of flow. Worsening right mid and bilateral perihilar heterogeneous opacities. Trace bilateral effusions. No pneumothorax. No acute osseus abnormalities. IMPRESSION: Findings most worrisome for pulmonary edema with scattered areas of suspected atelectasis, though note, atypical infection could have a similar appearance. A follow-up chest radiograph in 3 to 4 weeks after treatment is recommended to ensure resolution. Electronically Signed   By: Sandi Mariscal M.D.   On: 07/05/2017 19:08   Dg Abd 1 View  Result Date: 07/07/2017 CLINICAL DATA:  Abdominal distention EXAM: ABDOMEN - 1 VIEW COMPARISON:  None. FINDINGS: Colon is diffusely gas-filled without significant distention. Gas-filled nondistended stomach. No small bowel distention. Moderate stool in the rectum. Changes most likely to represent ileus. No radiopaque stones. Degenerative changes in the spine. Vascular calcifications. IMPRESSION: Gas-filled nondistended colon likely to represent ileus. No findings suggesting obstruction. Electronically Signed   By: Lucienne Capers M.D.   On: 07/07/2017 00:27   Ct Head Wo Contrast  Result Date: 07/05/2017 CLINICAL DATA:  Diaphoresis,  encephalopathy. EXAM: CT HEAD WITHOUT CONTRAST TECHNIQUE: Contiguous axial images were obtained from the base of the skull through the vertex without intravenous contrast. COMPARISON:  None. FINDINGS: Brain: No evidence of acute infarction, hemorrhage, hydrocephalus, extra-axial collection or mass lesion/mass effect. Mild age related involutional changes of the brain. Vascular: No hyperdense vessels. Skull: No acute fracture or suspicious osseous lesions. Sinuses/Orbits: Trace air-fluid level in the right maxillary sinus. Otherwise the paranasal sinuses are clear as are the mastoids.  Intact orbits and globes. Other: None IMPRESSION: Mild age related involutional changes of the brain. No acute intracranial abnormality. Trace fluid in the right maxillary sinus may represent mild sinusitis. Electronically Signed   By: Ashley Royalty M.D.   On: 07/05/2017 19:29   US Renal  Result Date: 07/06/2017 CLINICAL DATA:  Acute renal failure EXAM: RENAL / URINARY TRACT ULTRASOUND COMPLETE COMPARISON:  None in PACs FINDINGS: Right Kidney: Length: 10.1 cm. The renal cortical echotexture is approximately equal to that of the adjacent liver. There is no hydronephrosis. Left Kidney: Length: 11.8 cm. The renal cortical echotexture is similar to that on the right. There is no hydronephrosis. Bladder: Appears normal for degree of bladder distention. IMPRESSION: No hydronephrosis. No acute abnormality of either kidney. Normal appearing urinary bladder. Electronically Signed   By: David  Martinique M.D.   On: 07/06/2017 10:04     Medications:   . azithromycin Stopped (07/06/17 1721)  . cefTRIAXone (ROCEPHIN) IVPB 1 gram/50 mL D5W Stopped (07/06/17 1751)  . sodium chloride (hypertonic)     . amLODipine  10 mg Oral Daily  . aspirin EC  81 mg Oral Daily  . atorvastatin  40 mg Oral q1800  . budesonide (PULMICORT) nebulizer solution  0.5 mg Nebulization BID  . enoxaparin (LOVENOX) injection  40 mg Subcutaneous Q24H  . insulin aspart   0-5 Units Subcutaneous QHS  . insulin aspart  0-9 Units Subcutaneous TID WC  . ipratropium-albuterol  3 mL Nebulization Q4H  . metoprolol succinate  100 mg Oral Daily  . sertraline  50 mg Oral Daily  . tamsulosin  0.4 mg Oral Daily   acetaminophen **OR** acetaminophen, bisacodyl, ondansetron **OR** ondansetron (ZOFRAN) IV  Assessment/ Plan:  84 y.o. male  with a PMHx of BPH, chronic kidney disease stage III, diabetes mellitus type 2, hypertension, hyperlipidemia, who was admitted to [facility] on 07/05/2017 for evaluation of altered mental status.  1.  Hyponatremia, secondary to HCTZ most likely.  2.  CKD stage III. 3.  Hypertension. 4.  Diabetes mellitus type 2 with CKD.  Plan:  The patient's hyponatremia has significantly and appropriately improved.  Serum sodium at the moment 119.  Case discussed with pharmacy and we will reinitiate 3% saline at 25 cc/h.  Continue to monitor serum sodium closely.  Renal function appears to be overall stable.  Otherwise management as per primary team.     LOS: 2 Morenike Cuff 1/25/20199:17 AM

## 2017-07-07 NOTE — Progress Notes (Signed)
[facility] Progess Note    SYNOPSIS   84 yo male admitted with acute encephalopathy secondary to severe hyponatremia and acute hypoxic respiratory failure secondary to pulmonary edema vs. atypical infection  ASSESSMENT/PLAN   Hyponatremia. Patient with hyperosmolar hyponatremia with low urinary sodium and elevated urinary osmolality most likely secondary to diuretic use and solute depletion. Presently getting 3% saline. Will monitor closely to make sure no more than 8 mEq increase in a 24-hour timeframe. Patient is on a regular sodium checks at this time. Appreciate nephrology's input  Renal insufficiency. Has been rehydrated, ultrasound did not reveal any evidence of hydronephrosis, presently on 3% saline  Hypochloremia. Most likely reflects slight deficiency secondary to diuretic therapy  Mildly elevated troponin. EKG reveals left bundle branch block. Will follow enzymes closely  Critical care time 35 minutes   INTAKE / OUTPUT:  Intake/Output Summary (Last 24 hours) at 07/07/2017 1045 Last data filed at 07/07/2017 1006 Gross per 24 hour  Intake 1470.75 ml  Output 870 ml  Net 600.75 ml     Name: William Morse MRN: [id number] DOB: Jul 02, 1930    ADMISSION DATE:  07/05/2017  SUBJECTIVE:   Agent is awake, alert, somewhat confused, voices no complaints at this time. Presently getting a breathing treatment   VITAL SIGNS: Temp:  [97.4 F (36.3 C)-98.7 F (37.1 C)] 98.5 F (36.9 C) (01/25 0800) Pulse Rate:  [61-92] 83 (01/25 1000) Resp:  [12-31] 28 (01/25 1000) BP: (103-180)/(48-99) 176/64 (01/25 1000) SpO2:  [90 %-99 %] 91 % (01/25 1000) FiO2 (%):  [28 %-36 %] 28 % (01/25 0334)   PHYSICAL EXAMINATION: Physical Examination:   VS: BP (!) 176/64   Pulse 83   Temp 98.5 F (36.9 C) (Oral)   Resp (!) 28   Ht 5\' 7"  (1.702 m)   Wt 180 lb 12.4 oz (82 kg)   SpO2 91%   BMI 28.31 kg/m   General Appearance: No distress  Neuro:without focal  findings, alert, pleasant and responsive, somewhat confused HEENT: PERRLA, EOM intact. Pulmonary: normal breath sounds   CardiovascularNormal S1,S2.  No m/r/g.   Abdomen: Benign, Soft, non-tender. Skin:   warm, no rashes, no ecchymosis  Extremities: normal, no cyanosis, clubbing.  LABORATORY PANEL:   CBC Recent Labs  Lab 07/06/17 0445  WBC 7.7  HGB 10.7*  HCT 29.7*  PLT 245    Chemistries  Recent Labs  Lab 07/05/17 1742  07/06/17 0445  07/07/17 0411  NA 107*   < > 112*   < > 119*  K 4.3  --  3.7  --   --   CL 74*  --  82*  --   --   CO2 22  --  23  --   --   GLUCOSE 204*  --  88  --   --   BUN 37*  --  40*  --   --   CREATININE 1.35*  --  1.47*  --   --   CALCIUM 7.9*  --  7.6*  --   --   AST 141*  --   --   --   --   ALT 50  --   --   --   --   ALKPHOS 59  --   --   --   --   BILITOT 1.0  --   --   --   --    < > = values in this interval not displayed.  Recent Labs  Lab 07/06/17 0704 07/06/17 0721 07/06/17 1100 07/06/17 1606 07/06/17 2204 07/07/17 0759  GLUCAP 56* 119* 206* 221* 259* 96   No results for input(s): PHART, PCO2ART, PO2ART in the last 168 hours. Recent Labs  Lab 07/05/17 1742  AST 141*  ALT 50  ALKPHOS 59  BILITOT 1.0  ALBUMIN 3.6    Cardiac Enzymes Recent Labs  Lab 07/06/17 1247  TROPONINI 0.22*    RADIOLOGY:  Dg Chest 2 View  Result Date: 07/05/2017 CLINICAL DATA:  Shortness of breath, dizziness and weakness. EXAM: CHEST  2 VIEW COMPARISON:  09/29/2016 FINDINGS: Grossly unchanged enlarged cardiac silhouette and mediastinal contours with atherosclerotic plaque within the thoracic aorta. The pulmonary vasculature appears indistinct with cephalization of flow. Worsening right mid and bilateral perihilar heterogeneous opacities. Trace bilateral effusions. No pneumothorax. No acute osseus abnormalities. IMPRESSION: Findings most worrisome for pulmonary edema with scattered areas of suspected atelectasis, though note, atypical  infection could have a similar appearance. A follow-up chest radiograph in 3 to 4 weeks after treatment is recommended to ensure resolution. Electronically Signed   By: Sandi Mariscal M.D.   On: 07/05/2017 19:08   Dg Abd 1 View  Result Date: 07/07/2017 CLINICAL DATA:  Abdominal distention EXAM: ABDOMEN - 1 VIEW COMPARISON:  None. FINDINGS: Colon is diffusely gas-filled without significant distention. Gas-filled nondistended stomach. No small bowel distention. Moderate stool in the rectum. Changes most likely to represent ileus. No radiopaque stones. Degenerative changes in the spine. Vascular calcifications. IMPRESSION: Gas-filled nondistended colon likely to represent ileus. No findings suggesting obstruction. Electronically Signed   By: Lucienne Capers M.D.   On: 07/07/2017 00:27   Ct Head Wo Contrast  Result Date: 07/05/2017 CLINICAL DATA:  Diaphoresis, encephalopathy. EXAM: CT HEAD WITHOUT CONTRAST TECHNIQUE: Contiguous axial images were obtained from the base of the skull through the vertex without intravenous contrast. COMPARISON:  None. FINDINGS: Brain: No evidence of acute infarction, hemorrhage, hydrocephalus, extra-axial collection or mass lesion/mass effect. Mild age related involutional changes of the brain. Vascular: No hyperdense vessels. Skull: No acute fracture or suspicious osseous lesions. Sinuses/Orbits: Trace air-fluid level in the right maxillary sinus. Otherwise the paranasal sinuses are clear as are the mastoids. Intact orbits and globes. Other: None IMPRESSION: Mild age related involutional changes of the brain. No acute intracranial abnormality. Trace fluid in the right maxillary sinus may represent mild sinusitis. Electronically Signed   By: Ashley Royalty M.D.   On: 07/05/2017 19:29   US Renal  Result Date: 07/06/2017 CLINICAL DATA:  Acute renal failure EXAM: RENAL / URINARY TRACT ULTRASOUND COMPLETE COMPARISON:  None in PACs FINDINGS: Right Kidney: Length: 10.1 cm. The renal  cortical echotexture is approximately equal to that of the adjacent liver. There is no hydronephrosis. Left Kidney: Length: 11.8 cm. The renal cortical echotexture is similar to that on the right. There is no hydronephrosis. Bladder: Appears normal for degree of bladder distention. IMPRESSION: No hydronephrosis. No acute abnormality of either kidney. Normal appearing urinary bladder. Electronically Signed   By: David  Martinique M.D.   On: 07/06/2017 10:04     Hermelinda Dellen, DO  1/25/2019Patient ID: William Morse, male   DOB: 17-Jan-1931, 84 y.o.   MRN: [id number]

## 2017-07-07 NOTE — Progress Notes (Signed)
MEDICATION RELATED CONSULT NOTE - INITIAL   Pharmacy Consult for hypertonic saline monitoring Indication: hyponatremia  No Known Allergies  Patient Measurements: Height: 5\' 7"  (170.2 cm) Weight: 180 lb 12.4 oz (82 kg) IBW/kg (Calculated) : 66.1 Adjusted Body Weight: n/a  Vital Signs: Temp: 99 F (37.2 C) (01/25 2002) Temp Source: Oral (01/25 2002) BP: 115/59 (01/25 1500) Pulse Rate: 70 (01/25 1937) Intake/Output from previous day: 01/24 0701 - 01/25 0700 In: 1110.8 [I.V.:810.8; IV Piggyback:300] Out: 1095 [Urine:1095] Intake/Output from this shift: Total I/O In: 26.7 [I.V.:26.7] Out: 600 [Urine:600]  Labs: Recent Labs    07/05/17 1742 07/06/17 0445 07/06/17 1002  WBC 7.7 7.7  --   HGB 12.6* 10.7*  --   HCT 35.1* 29.7*  --   PLT 290 245  --   CREATININE 1.35* 1.47*  --   LABCREA  --   --  79  ALBUMIN 3.6  --   --   PROT 7.2  --   --   AST 141*  --   --   ALT 50  --   --   ALKPHOS 59  --   --   BILITOT 1.0  --   --    Estimated Creatinine Clearance: 38.4 mL/min (A) (by C-G formula based on SCr of 1.47 mg/dL (H)).   Medical History: Past Medical History:  Diagnosis Date  . BPH (benign prostatic hyperplasia)   . CKD (chronic kidney disease), stage III ([place])   . Diabetes mellitus without complication ([place])   . Hypercholesteremia   . Hypertension     Medications:  NaCl 3% at 25 mL/hr.  Assessment:  Pharmacy consulted for sodium monitoring in patient admitted with hyponatremia and started on hypertonic saline.   Na+  107 on admission (1/23 @107  @1742 )   Goal of Therapy:  Na Increase ~8 mmol/L in 24 hours  Plan:  Sodium  1/24  0045   109 mmol/L     1/24  0445   112 mmol/L                1/24  1115   111 mmol/L     1/24  1510   113 mmol/L     1/24  1834   113 mmol/L                  1/24  2330   116 mmol/L Rate dec to 20 ml/hr      1/25 0400    119 mmol/L Stopped until 1000                1/25 1219    117 mmol/L     1/25 1546    118 mmol/L                             1/25 2115    120 mmol/L   Hypertonic (3%) Saline restarted by Dr. Holley Raring at 1000 on 1/25 at 30ml/hr. Na has increased by 7 mmol/L in the last 24hr (max increase is 8) Continue current rate. Continue to monitor sodium levels every 4 hours.   Orene Desanctis, PharmD Clinical Pharmacist 07/07/2017 11:00 PM

## 2017-07-07 NOTE — Progress Notes (Signed)
MEDICATION RELATED CONSULT NOTE - INITIAL   Pharmacy Consult for hypertonic saline monitoring Indication: hyponatremia  No Known Allergies  Patient Measurements: Height: 5\' 7"  (170.2 cm) Weight: 180 lb 12.4 oz (82 kg) IBW/kg (Calculated) : 66.1 Adjusted Body Weight: n/a  Vital Signs: Temp: 98.7 F (37.1 C) (01/25 1400) Temp Source: Oral (01/25 1400) BP: 115/59 (01/25 1500) Pulse Rate: 67 (01/25 1500) Intake/Output from previous day: 01/24 0701 - 01/25 0700 In: 1110.8 [I.V.:810.8; IV Piggyback:300] Out: 1095 [Urine:1095] Intake/Output from this shift: Total I/O In: 600 [P.O.:600] Out: -   Labs: Recent Labs    07/05/17 1742 07/06/17 0445 07/06/17 1002  WBC 7.7 7.7  --   HGB 12.6* 10.7*  --   HCT 35.1* 29.7*  --   PLT 290 245  --   CREATININE 1.35* 1.47*  --   LABCREA  --   --  79  ALBUMIN 3.6  --   --   PROT 7.2  --   --   AST 141*  --   --   ALT 50  --   --   ALKPHOS 59  --   --   BILITOT 1.0  --   --    Estimated Creatinine Clearance: 38.4 mL/min (A) (by C-G formula based on SCr of 1.47 mg/dL (H)).   Medical History: Past Medical History:  Diagnosis Date  . BPH (benign prostatic hyperplasia)   . CKD (chronic kidney disease), stage III ([place])   . Diabetes mellitus without complication ([place])   . Hypercholesteremia   . Hypertension     Medications:  NaCl 3% at 25 mL/hr.  Assessment:  Pharmacy consulted for sodium monitoring in patient admitted with hyponatremia and started on hypertonic saline.   Na+  107 on admission (1/23 @107  @1742 )   Goal of Therapy:  Na Increase ~8 mmol/L in 24 hours  Plan:  Sodium  1/24  0045   109 mmol/L     1/24  0445   112 mmol/L                1/24  1115   111 mmol/L     1/24  1510   113 mmol/L     1/24  1834   113 mmol/L                  1/24  2330   116 mmol/L Rate dec to 20 ml/hr      1/25 0400    119 mmol/L Stopped until 1000                1/25 1219    117 mmol/L     1/25 1546    118 mmol/L  Hypertonic (3%)  Saline restarted by Dr. Holley Raring at 1000 on 1/25 at 70ml/hr. Na has increased by 5 mmol/L in the last 24hr (max increase is 8) Continue current rate. Continue to monitor sodium levels every 4 hours.   Ramond Dial, PharmD, BCPS Clinical Pharmacist 07/07/2017 4:46 PM

## 2017-07-07 NOTE — Progress Notes (Signed)
MEDICATION RELATED CONSULT NOTE - INITIAL   Pharmacy Consult for hypertonic saline monitoring Indication: hyponatremia  No Known Allergies  Patient Measurements: Height: 5\' 7"  (170.2 cm) Weight: 180 lb 12.4 oz (82 kg) IBW/kg (Calculated) : 66.1 Adjusted Body Weight: n/a  Vital Signs: Temp: 98.5 F (36.9 C) (01/25 0800) Temp Source: Oral (01/25 0800) BP: 176/64 (01/25 1000) Pulse Rate: 83 (01/25 1000) Intake/Output from previous day: 01/24 0701 - 01/25 0700 In: 1110.8 [I.V.:810.8; IV Piggyback:300] Out: 1095 [Urine:1095] Intake/Output from this shift: Total I/O In: 360 [P.O.:360] Out: -   Labs: Recent Labs    07/05/17 1742 07/06/17 0445 07/06/17 1002  WBC 7.7 7.7  --   HGB 12.6* 10.7*  --   HCT 35.1* 29.7*  --   PLT 290 245  --   CREATININE 1.35* 1.47*  --   LABCREA  --   --  79  ALBUMIN 3.6  --   --   PROT 7.2  --   --   AST 141*  --   --   ALT 50  --   --   ALKPHOS 59  --   --   BILITOT 1.0  --   --    Estimated Creatinine Clearance: 38.4 mL/min (A) (by C-G formula based on SCr of 1.47 mg/dL (H)).   Medical History: Past Medical History:  Diagnosis Date  . BPH (benign prostatic hyperplasia)   . CKD (chronic kidney disease), stage III ([place])   . Diabetes mellitus without complication ([place])   . Hypercholesteremia   . Hypertension     Medications:  NaCl 3% at 25 mL/hr.  Assessment:  Pharmacy consulted for sodium monitoring in patient admitted with hyponatremia and started on hypertonic saline.   Na+  107 on admission (1/23 @107  @1742 )   Goal of Therapy:  Na Increase ~8 mmol/L in 24 hours  Plan:  Sodium  1/24  0045   109 mmol/L     1/24  0445   112 mmol/L                1/24  1115   111 mmol/L     1/24  1510   113 mmol/L     1/24  1834   113 mmol/L                  1/24  2330   116 mmol/L Rate dec to 20 ml/hr      1/25 0400    119 mmol/L Stopped until 1000                1/25 1219    117 mmol/L  Hypertonic (3%) Saline restarted by Dr.  Holley Raring at 1000 on 1/25 at 58ml/hr. Continue current rate. Continue to monitor sodium levels every 4 hours.   Pernell Dupre, PharmD, BCPS Clinical Pharmacist 07/07/2017 1:30 PM

## 2017-07-08 ENCOUNTER — Inpatient Hospital Stay: Payer: PPO

## 2017-07-08 LAB — GLUCOSE, CAPILLARY
GLUCOSE-CAPILLARY: 110 mg/dL — AB (ref 65–99)
GLUCOSE-CAPILLARY: 169 mg/dL — AB (ref 65–99)
GLUCOSE-CAPILLARY: 223 mg/dL — AB (ref 65–99)
GLUCOSE-CAPILLARY: 233 mg/dL — AB (ref 65–99)
Glucose-Capillary: 209 mg/dL — ABNORMAL HIGH (ref 65–99)

## 2017-07-08 LAB — BASIC METABOLIC PANEL
Anion gap: 9 (ref 5–15)
BUN: 44 mg/dL — AB (ref 6–20)
CHLORIDE: 90 mmol/L — AB (ref 101–111)
CO2: 20 mmol/L — AB (ref 22–32)
CREATININE: 1.55 mg/dL — AB (ref 0.61–1.24)
Calcium: 7.9 mg/dL — ABNORMAL LOW (ref 8.9–10.3)
GFR calc Af Amer: 46 mL/min — ABNORMAL LOW (ref >60)
GFR calc non Af Amer: 39 mL/min — ABNORMAL LOW (ref >60)
GLUCOSE: 142 mg/dL — AB (ref 65–99)
Potassium: 3.8 mmol/L (ref 3.5–5.1)
Sodium: 119 mmol/L — CL (ref 135–145)

## 2017-07-08 LAB — SODIUM
SODIUM: 126 mmol/L — AB (ref 135–145)
SODIUM: 125 mmol/L — AB (ref 135–145)
Sodium: 122 mmol/L — ABNORMAL LOW (ref 135–145)
Sodium: 121 mmol/L — ABNORMAL LOW (ref 135–145)
Sodium: 126 mmol/L — ABNORMAL LOW (ref 135–145)

## 2017-07-08 LAB — CBC
HEMATOCRIT: 29.3 % — AB (ref 40.0–52.0)
HEMOGLOBIN: 10.2 g/dL — AB (ref 13.0–18.0)
MCH: 32.6 pg (ref 26.0–34.0)
MCHC: 34.9 g/dL (ref 32.0–36.0)
MCV: 93.4 fL (ref 80.0–100.0)
Platelets: 223 10*3/uL (ref 150–440)
RBC: 3.14 MIL/uL — ABNORMAL LOW (ref 4.40–5.90)
RDW: 12.2 % (ref 11.5–14.5)
WBC: 7.6 10*3/uL (ref 3.8–10.6)

## 2017-07-08 LAB — MAGNESIUM: Magnesium: 1.7 mg/dL (ref 1.7–2.4)

## 2017-07-08 LAB — PHOSPHORUS: Phosphorus: 3.8 mg/dL (ref 2.5–4.6)

## 2017-07-08 MED ORDER — AMLODIPINE BESYLATE 10 MG PO TABS
10.0000 mg | ORAL_TABLET | Freq: Every evening | ORAL | Status: DC
  Administered 2017-07-09: 10 mg via ORAL
  Filled 2017-07-08: qty 1

## 2017-07-08 MED ORDER — HYDRALAZINE HCL 50 MG PO TABS
25.0000 mg | ORAL_TABLET | Freq: Four times a day (QID) | ORAL | Status: DC | PRN
  Administered 2017-07-08 – 2017-07-10 (×4): 25 mg via ORAL
  Filled 2017-07-08 (×4): qty 1

## 2017-07-08 NOTE — Progress Notes (Signed)
Pharmacy Antibiotic Note  William Morse is a 84 y.o. male admitted on 07/05/2017 with pneumonia.  Pharmacy has been consulted for ceftriaxone dosing. Patient is also receiving Azithromycin.   Plan: Ceftriaxone 1 gram q 24 hours ordered.  Height: 5\' 7"  (170.2 cm) Weight: 180 lb 12.4 oz (82 kg) IBW/kg (Calculated) : 66.1  Temp (24hrs), Avg:98.6 F (37 C), Min:98.2 F (36.8 C), Max:99 F (37.2 C)  Recent Labs  Lab 07/05/17 1742 07/06/17 0445 07/08/17 0147  WBC 7.7 7.7 7.6  CREATININE 1.35* 1.47* 1.55*    Estimated Creatinine Clearance: 36.4 mL/min (A) (by C-G formula based on SCr of 1.55 mg/dL (H)).    No Known Allergies  Antimicrobials this admission: Ceftriaxone 1/23 azithromycin 1/23  >> 1/26  Dose adjustments this admission:  Microbiology results: 1/23 BCx: NG TD 1/24 MRSA PCR (-)   1/23 CXR: edema vs. atelectasis vs. atypical infection  Thank you for allowing pharmacy to be a part of this patient's care.  Rocky Morel, PharmD, BCPS Clinical Pharmacist 07/08/2017 11:01 AM

## 2017-07-08 NOTE — Progress Notes (Signed)
[facility] [place] NAME: William Morse    MR#:  [id number]  DATE OF BIRTH:  1931/04/24  SUBJECTIVE:  CHIEF COMPLAINT:   Chief Complaint  Patient presents with  . Altered Mental Status   Patient's mental status improved sodium is slowly improving REVIEW OF SYSTEMS:  Review of Systems  Constitutional: Negative for chills, fever and weight loss.  HENT: Negative for nosebleeds and sore throat.   Eyes: Negative for blurred vision.  Respiratory: Negative for cough, shortness of breath and wheezing.   Cardiovascular: Negative for chest pain, orthopnea, leg swelling and PND.  Gastrointestinal: Negative for abdominal pain, constipation, diarrhea, heartburn, nausea and vomiting.  Genitourinary: Negative for dysuria and urgency.  Musculoskeletal: Negative for back pain.  Skin: Negative for rash.  Neurological: Negative for dizziness, speech change, focal weakness and headaches.  Endo/Heme/Allergies: Does not bruise/bleed easily.  Psychiatric/Behavioral: Negative for depression.    DRUG ALLERGIES:  No Known Allergies VITALS:  Blood pressure (!) 186/71, pulse 77, temperature 98.7 F (37.1 C), temperature source Oral, resp. rate 19, height 5\' 7"  (1.702 m), weight 180 lb 12.4 oz (82 kg), SpO2 97 %. PHYSICAL EXAMINATION:  Physical Exam  Constitutional: He is oriented to person, place, and time and well-developed, well-nourished, and in no distress.  HENT:  Head: Normocephalic and atraumatic.  Eyes: Conjunctivae and EOM are normal. Pupils are equal, round, and reactive to light.  Neck: Normal range of motion. Neck supple. No tracheal deviation present. No thyromegaly present.  Cardiovascular: Normal rate, regular rhythm and normal heart sounds.  Pulmonary/Chest: Effort normal and breath sounds normal. No respiratory distress. He has no wheezes. He exhibits no tenderness.  Abdominal: Soft. Bowel sounds are normal. He exhibits no distension.  There is no tenderness.  Musculoskeletal: Normal range of motion.  Neurological: He is alert and oriented to person, place, and time. No cranial nerve deficit.  Skin: Skin is warm and dry. No rash noted.  Psychiatric: Mood and affect normal.   LABORATORY PANEL:  Male CBC Recent Labs  Lab 07/08/17 0147  WBC 7.6  HGB 10.2*  HCT 29.3*  PLT 223   ------------------------------------------------------------------------------------------------------------------ Chemistries  Recent Labs  Lab 07/05/17 1742  07/08/17 0147  07/08/17 1356  NA 107*   < > 119*   < > 126*  K 4.3   < > 3.8  --   --   CL 74*   < > 90*  --   --   CO2 22   < > 20*  --   --   GLUCOSE 204*   < > 142*  --   --   BUN 37*   < > 44*  --   --   CREATININE 1.35*   < > 1.55*  --   --   CALCIUM 7.9*   < > 7.9*  --   --   MG  --   --  1.7  --   --   AST 141*  --   --   --   --   ALT 50  --   --   --   --   ALKPHOS 59  --   --   --   --   BILITOT 1.0  --   --   --   --    < > = values in this interval not displayed.   RADIOLOGY:  Dg Abd 1 View  Result Date: 07/08/2017 CLINICAL DATA:  Abdominal  distention EXAM: ABDOMEN - 1 VIEW COMPARISON:  07/06/2017 FINDINGS: Scattered gas within the colon with mild transverse colonic distention. Changes likely to represent ileus. No small bowel distention. No radiopaque stones. Degenerative changes in the spine. Vascular calcifications. IMPRESSION: Mildly distended gas-filled colon most likely to represent ileus. No evidence of small bowel obstruction. Electronically Signed   By: Lucienne Capers M.D.   On: 07/08/2017 05:30   ASSESSMENT AND PLAN:  84 y.o. male with a PMHx of BPH, chronic kidney disease stage III, diabetes mellitus type 2, hypertension, hyperlipidemia, who was admitted to [facility] on 07/05/2017 for evaluation of altered mental status  * Acute hyponatremia - likely secondary to HCTZ  -Sodium is continue to improve -Follow closely -Appreciate nephrology input  *  CAP  (community acquired pneumonia) -continue Rocephin + Zithromax   * HTN (hypertension) -continue Norvasc, Metoprolol  * Diabetes ([place]) -sliding scale insulin with corresponding glucose checks  * HLD (hyperlipidemia) - continue lipitor * BPH (benign prostatic hyperplasia) -continue Flomax * CKD (chronic kidney disease), stage III ([place]) -at baseline, monitor and avoid nephrotoxins        All the records are reviewed and case discussed with Care Management/Social Worker. Management plans discussed with the patient, family and they are in agreement.  CODE STATUS: Full Code  TOTAL TIME TAKING CARE OF THIS PATIENT: 15 minutes.   More than 50% of the time was spent in counseling/coordination of care: YES  POSSIBLE D/C IN 2-3 DAYS, DEPENDING ON CLINICAL CONDITION.   Dustin Flock M.D on 07/08/2017 at 3:34 PM  Between 7am to 6pm - Pager - [phone]  After 6pm go to [email or web address] - Proofreader  [facility]  Office  [phone]  CC: Primary care physician; Leonel Ramsay, MD  Note: This dictation was prepared with Dragon dictation along with smaller phrase technology. Any transcriptional errors that result from this process are unintentional.

## 2017-07-08 NOTE — Progress Notes (Signed)
Spoke with Nevin Bloodgood in lab concerning patient's q 4 hour sodium lab draw that was due at [place]. Per Nevin Bloodgood there is not a q4 hours sodium serum order that is able to be seen in [facility]. New order entered for sodium serum level now and will correct previous q 4 hours sodium serum order.

## 2017-07-08 NOTE — Progress Notes (Addendum)
MEDICATION RELATED CONSULT NOTE - INITIAL   Pharmacy Consult for hypertonic saline monitoring Indication: hyponatremia  No Known Allergies  Patient Measurements: Height: 5\' 7"  (170.2 cm) Weight: 180 lb 12.4 oz (82 kg) IBW/kg (Calculated) : 66.1 Adjusted Body Weight: n/a  Vital Signs: Temp: 98.3 F (36.8 C) (01/26 0200) Temp Source: Oral (01/26 0200) BP: 129/58 (01/26 0300) Pulse Rate: 69 (01/26 0300) Intake/Output from previous day: 01/25 0701 - 01/26 0700 In: 1022.9 [P.O.:600; I.V.:422.9] Out: 750 [Urine:750] Intake/Output from this shift: Total I/O In: 200 [I.V.:200] Out: 750 [Urine:750]  Labs: Recent Labs    07/05/17 1742 07/06/17 0445 07/06/17 1002 07/08/17 0147  WBC 7.7 7.7  --  7.6  HGB 12.6* 10.7*  --  10.2*  HCT 35.1* 29.7*  --  29.3*  PLT 290 245  --  223  CREATININE 1.35* 1.47*  --  1.55*  LABCREA  --   --  79  --   MG  --   --   --  1.7  PHOS  --   --   --  3.8  ALBUMIN 3.6  --   --   --   PROT 7.2  --   --   --   AST 141*  --   --   --   ALT 50  --   --   --   ALKPHOS 59  --   --   --   BILITOT 1.0  --   --   --    Estimated Creatinine Clearance: 36.4 mL/min (A) (by C-G formula based on SCr of 1.55 mg/dL (H)).   Medical History: Past Medical History:  Diagnosis Date  . BPH (benign prostatic hyperplasia)   . CKD (chronic kidney disease), stage III ([place])   . Diabetes mellitus without complication ([place])   . Hypercholesteremia   . Hypertension     Medications:  NaCl 3% at 25 mL/hr.  Assessment:  Pharmacy consulted for sodium monitoring in patient admitted with hyponatremia and started on hypertonic saline.   Na+  107 on admission (1/23 @107  @1742 )   Goal of Therapy:  Na Increase ~8 mmol/L in 24 hours  Plan:  Sodium  1/24  0045   109 mmol/L     1/24  0445   112 mmol/L                1/24  1115   111 mmol/L     1/24  1510   113 mmol/L     1/24  1834   113 mmol/L                  1/24  2330   116 mmol/L Rate dec to 20 ml/hr   1/25 0400    119 mmol/L Stopped until 1000                1/25 1219    117 mmol/L     1/25 1546    118 mmol/L                            1/25 2115    120 mmol/L      1/26 0200    119 mmol/L     1/26 0530    122 mmol/L  Hypertonic (3%) Saline restarted by Dr. Holley Raring at 1000 on 1/25 at 1ml/hr. Na has increased by 7 mmol/L in the last 24hr (max increase is 8) Continue current rate. Continue  to monitor sodium levels every 4 hours.   Eloise Harman, PharmD Clinical Pharmacist 07/08/2017 3:12 AM

## 2017-07-08 NOTE — Progress Notes (Signed)
Lab in to draw patient's blood for Na level. Pt request lab to come back when he has finished eating supper.

## 2017-07-08 NOTE — Progress Notes (Signed)
[facility] Progess Note    SYNOPSIS   84 yo male admitted with acute encephalopathy secondary to severe hyponatremia and acute hypoxic respiratory failure secondary to pulmonary edema vs. atypical infection  ASSESSMENT/PLAN   Hyponatremia. Patient with hyperosmolar hyponatremia with low urinary sodium and elevated urinary osmolality most likely secondary to diuretic use and solute depletion. Presently getting 3% saline. Minimal sodium increased to 122. Appreciate nephrology's input, 3% sodium has been doubled. Will follow frequent sodium checks.   Renal insufficiency. Has been rehydrated, ultrasound did not reveal any evidence of hydronephrosis, presently on 3% saline. BUN/creatinine slightly worsening today nephrology following  Hypochloremia. Has improved to 90 from 74 with 3% saline  INTAKE / OUTPUT:  Intake/Output Summary (Last 24 hours) at 07/08/2017 0921 Last data filed at 07/08/2017 0600 Gross per 24 hour  Intake 1097.92 ml  Output 895 ml  Net 202.92 ml     Name: William Morse MRN: [id number] DOB: Oct 17, 1930    ADMISSION DATE:  07/05/2017  SUBJECTIVE:   William Morse is awake, alert, somewhat confused, voices no complaints at this time. Presently getting a breathing treatment   VITAL SIGNS: Temp:  [98.2 F (36.8 C)-99 F (37.2 C)] 98.7 F (37.1 C) (01/26 0800) Pulse Rate:  [61-86] 77 (01/26 0800) Resp:  [10-34] 14 (01/26 0800) BP: (115-199)/(47-78) 199/72 (01/26 0814) SpO2:  [90 %-97 %] 93 % (01/26 0800)   PHYSICAL EXAMINATION: Physical Examination:   VS: BP (!) 199/72 (BP Location: Right Arm)   Pulse 77   Temp 98.7 F (37.1 C) (Oral)   Resp 14   Ht 5\' 7"  (1.702 m)   Wt 180 lb 12.4 oz (82 kg)   SpO2 93%   BMI 28.31 kg/m   General Appearance: No distress  Neuro:without focal findings, alert, pleasant and responsive, somewhat confused HEENT: PERRLA, EOM intact. Pulmonary: normal breath sounds   CardiovascularNormal S1,S2.  No  m/r/g.   Abdomen: Benign, Soft, non-tender. Skin:   warm, no rashes, no ecchymosis  Extremities: normal, no cyanosis, clubbing.  LABORATORY PANEL:   CBC Recent Labs  Lab 07/08/17 0147  WBC 7.6  HGB 10.2*  HCT 29.3*  PLT 223    Chemistries  Recent Labs  Lab 07/05/17 1742  07/08/17 0147 07/08/17 0527  NA 107*   < > 119* 122*  K 4.3   < > 3.8  --   CL 74*   < > 90*  --   CO2 22   < > 20*  --   GLUCOSE 204*   < > 142*  --   BUN 37*   < > 44*  --   CREATININE 1.35*   < > 1.55*  --   CALCIUM 7.9*   < > 7.9*  --   MG  --   --  1.7  --   PHOS  --   --  3.8  --   AST 141*  --   --   --   ALT 50  --   --   --   ALKPHOS 59  --   --   --   BILITOT 1.0  --   --   --    < > = values in this interval not displayed.    Recent Labs  Lab 07/06/17 2204 07/07/17 0759 07/07/17 1135 07/07/17 1659 07/07/17 2142 07/08/17 0736  GLUCAP 259* 96 205* 231* 150* 169*   No results for input(s): PHART, PCO2ART, PO2ART in the last 168 hours.  Recent Labs  Lab 07/05/17 1742  AST 141*  ALT 50  ALKPHOS 59  BILITOT 1.0  ALBUMIN 3.6    Cardiac Enzymes Recent Labs  Lab 07/06/17 1247  TROPONINI 0.22*    RADIOLOGY:  Dg Abd 1 View  Result Date: 07/08/2017 CLINICAL DATA:  Abdominal distention EXAM: ABDOMEN - 1 VIEW COMPARISON:  07/06/2017 FINDINGS: Scattered gas within the colon with mild transverse colonic distention. Changes likely to represent ileus. No small bowel distention. No radiopaque stones. Degenerative changes in the spine. Vascular calcifications. IMPRESSION: Mildly distended gas-filled colon most likely to represent ileus. No evidence of small bowel obstruction. Electronically Signed   By: Lucienne Capers M.D.   On: 07/08/2017 05:30   Dg Abd 1 View  Result Date: 07/07/2017 CLINICAL DATA:  Abdominal distention EXAM: ABDOMEN - 1 VIEW COMPARISON:  None. FINDINGS: Colon is diffusely gas-filled without significant distention. Gas-filled nondistended stomach. No small bowel  distention. Moderate stool in the rectum. Changes most likely to represent ileus. No radiopaque stones. Degenerative changes in the spine. Vascular calcifications. IMPRESSION: Gas-filled nondistended colon likely to represent ileus. No findings suggesting obstruction. Electronically Signed   By: Lucienne Capers M.D.   On: 07/07/2017 00:27   US Renal  Result Date: 07/06/2017 CLINICAL DATA:  Acute renal failure EXAM: RENAL / URINARY TRACT ULTRASOUND COMPLETE COMPARISON:  None in PACs FINDINGS: Right Kidney: Length: 10.1 cm. The renal cortical echotexture is approximately equal to that of the adjacent liver. There is no hydronephrosis. Left Kidney: Length: 11.8 cm. The renal cortical echotexture is similar to that on the right. There is no hydronephrosis. Bladder: Appears normal for degree of bladder distention. IMPRESSION: No hydronephrosis. No acute abnormality of either kidney. Normal appearing urinary bladder. Electronically Signed   By: David  Martinique M.D.   On: 07/06/2017 10:04     Hermelinda Dellen, DO  1/26/2019Patient ID: William Morse, male   DOB: 1931-01-10, 84 y.o.   MRN: [id number] Patient ID: William Morse, male   DOB: [birth year], 84 y.o.   MRN: [id number]

## 2017-07-08 NOTE — Plan of Care (Signed)
A&O patient, resting well this shift. Denies pain and shortness of breath. Admitted with hyponatremia and currently having sodium serum checked q4hrs, last sodium level was 122, pharmacy has been consulted for electrolyte management. Pt had a foley catheter placed 07/06/17 for urine retention, draining clear yellow urine.

## 2017-07-08 NOTE — Progress Notes (Signed)
[facility]  ROUNDING NOTE   Subjective:  Patient continues to do well as compared to admission. Serum sodium rise has declined and currently 122. Patient eating breakfast this a.m.   Objective:  Vital signs in last 24 hours:  Temp:  [98.2 F (36.8 C)-99 F (37.2 C)] 98.7 F (37.1 C) (01/26 0800) Pulse Rate:  [61-92] 63 (01/26 0700) Resp:  [10-34] 10 (01/26 0700) BP: (115-183)/(47-99) 134/47 (01/26 0700) SpO2:  [90 %-97 %] 97 % (01/26 0700)  Weight change:  Filed Weights   07/05/17 1744 07/06/17 0119  Weight: 81.6 kg (180 lb) 82 kg (180 lb 12.4 oz)    Intake/Output: I/O last 3 completed shifts: In: 1699.3 [P.O.:600; I.V.:799.3; IV Piggyback:300] Out: 8416 [Urine:1765]   Intake/Output this shift:  No intake/output data recorded.  Physical Exam: General: No acute distress  Head: Normocephalic, atraumatic. Moist oral mucosal membranes  Eyes: Anicteric  Neck: Supple, trachea midline  Lungs:  Clear to auscultation, normal effort  Heart: S1S2 no rubs  Abdomen:  Soft, nontender, bowel sounds present  Extremities: Trace peripheral edema.  Neurologic: Awake, alert, following commands  Skin: No lesions       Basic Metabolic Panel: Recent Labs  Lab 07/05/17 1742  07/06/17 0445  07/07/17 1219 07/07/17 1546 07/07/17 2115 07/08/17 0147 07/08/17 0527  NA 107*   < > 112*   < > 117* 118* 120* 119* 122*  K 4.3  --  3.7  --   --   --   --  3.8  --   CL 74*  --  82*  --   --   --   --  90*  --   CO2 22  --  23  --   --   --   --  20*  --   GLUCOSE 204*  --  88  --   --   --   --  142*  --   BUN 37*  --  40*  --   --   --   --  44*  --   CREATININE 1.35*  --  1.47*  --   --   --   --  1.55*  --   CALCIUM 7.9*  --  7.6*  --   --   --   --  7.9*  --   MG  --   --   --   --   --   --   --  1.7  --   PHOS  --   --   --   --   --   --   --  3.8  --    < > = values in this interval not displayed.    Liver Function Tests: Recent Labs  Lab 07/05/17 1742   AST 141*  ALT 50  ALKPHOS 59  BILITOT 1.0  PROT 7.2  ALBUMIN 3.6   No results for input(s): LIPASE, AMYLASE in the last 168 hours. No results for input(s): AMMONIA in the last 168 hours.  CBC: Recent Labs  Lab 07/05/17 1742 07/06/17 0445 07/08/17 0147  WBC 7.7 7.7 7.6  HGB 12.6* 10.7* 10.2*  HCT 35.1* 29.7* 29.3*  MCV 89.8 89.7 93.4  PLT 290 245 223    Cardiac Enzymes: Recent Labs  Lab 07/05/17 1742 07/06/17 0047 07/06/17 0445 07/06/17 1247  TROPONINI 0.23* 0.23* 0.22* 0.22*    BNP: Invalid input(s): POCBNP  CBG: Recent Labs  Lab 07/07/17 0759 07/07/17 1135 07/07/17  1659 07/07/17 2142 07/08/17 0736  GLUCAP 96 205* 231* 150* 169*    Microbiology: Results for orders placed or performed during the hospital encounter of 07/05/17  Blood culture (routine x 2)     Status: None (Preliminary result)   Collection Time: 07/05/17  9:45 PM  Result Value Ref Range Status   Specimen Description BLOOD LT HAND  Final   Special Requests   Final    BOTTLES DRAWN AEROBIC AND ANAEROBIC Blood Culture adequate volume   Culture   Final    NO GROWTH 3 DAYS Performed at [facility], [address], [place], [place] [address]    Report Status PENDING  Incomplete  Blood culture (routine x 2)     Status: None (Preliminary result)   Collection Time: 07/05/17  9:45 PM  Result Value Ref Range Status   Specimen Description BLOOD RT [facility]  Final   Special Requests   Final    BOTTLES DRAWN AEROBIC AND ANAEROBIC Blood Culture adequate volume   Culture   Final    NO GROWTH 3 DAYS Performed at [facility], [address], [place], [place] [address]    Report Status PENDING  Incomplete  MRSA PCR Screening     Status: None   Collection Time: 07/06/17  7:16 AM  Result Value Ref Range Status   MRSA by PCR NEGATIVE NEGATIVE Final    Comment:        The GeneXpert MRSA Assay (FDA approved for NASAL specimens only), is one component of a comprehensive MRSA  colonization surveillance program. It is not intended to diagnose MRSA infection nor to guide or monitor treatment for MRSA infections. Performed at [facility], [address] [place], [place] [address]     Coagulation Studies: No results for input(s): LABPROT, INR in the last 72 hours.  Urinalysis: Recent Labs    07/05/17 1742  COLORURINE STRAW*  LABSPEC 1.010  PHURINE 5.0  GLUCOSEU NEGATIVE  HGBUR MODERATE*  BILIRUBINUR NEGATIVE  KETONESUR NEGATIVE  PROTEINUR 30*  NITRITE NEGATIVE  LEUKOCYTESUR NEGATIVE      Imaging: Dg Abd 1 View  Result Date: 07/08/2017 CLINICAL DATA:  Abdominal distention EXAM: ABDOMEN - 1 VIEW COMPARISON:  07/06/2017 FINDINGS: Scattered gas within the colon with mild transverse colonic distention. Changes likely to represent ileus. No small bowel distention. No radiopaque stones. Degenerative changes in the spine. Vascular calcifications. IMPRESSION: Mildly distended gas-filled colon most likely to represent ileus. No evidence of small bowel obstruction. Electronically Signed   By: Lucienne Capers M.D.   On: 07/08/2017 05:30   Dg Abd 1 View  Result Date: 07/07/2017 CLINICAL DATA:  Abdominal distention EXAM: ABDOMEN - 1 VIEW COMPARISON:  None. FINDINGS: Colon is diffusely gas-filled without significant distention. Gas-filled nondistended stomach. No small bowel distention. Moderate stool in the rectum. Changes most likely to represent ileus. No radiopaque stones. Degenerative changes in the spine. Vascular calcifications. IMPRESSION: Gas-filled nondistended colon likely to represent ileus. No findings suggesting obstruction. Electronically Signed   By: Lucienne Capers M.D.   On: 07/07/2017 00:27   US Renal  Result Date: 07/06/2017 CLINICAL DATA:  Acute renal failure EXAM: RENAL / URINARY TRACT ULTRASOUND COMPLETE COMPARISON:  None in PACs FINDINGS: Right Kidney: Length: 10.1 cm. The renal cortical echotexture is approximately equal to that  of the adjacent liver. There is no hydronephrosis. Left Kidney: Length: 11.8 cm. The renal cortical echotexture is similar to that on the right. There is no hydronephrosis. Bladder: Appears normal  for degree of bladder distention. IMPRESSION: No hydronephrosis. No acute abnormality of either kidney. Normal appearing urinary bladder. Electronically Signed   By: David  Martinique M.D.   On: 07/06/2017 10:04     Medications:   . cefTRIAXone (ROCEPHIN) IVPB 1 gram/50 mL D5W Stopped (07/07/17 1730)  . sodium chloride (hypertonic) 25 mL/hr (07/07/17 2004)   . amLODipine  10 mg Oral Daily  . aspirin EC  81 mg Oral Daily  . atorvastatin  40 mg Oral q1800  . azithromycin  500 mg Oral Daily  . budesonide (PULMICORT) nebulizer solution  0.5 mg Nebulization BID  . enoxaparin (LOVENOX) injection  40 mg Subcutaneous Q24H  . insulin aspart  0-5 Units Subcutaneous QHS  . insulin aspart  0-9 Units Subcutaneous TID WC  . ipratropium-albuterol  3 mL Nebulization Q6H  . metoprolol succinate  100 mg Oral Daily  . polyethylene glycol  17 g Oral Daily  . sertraline  50 mg Oral Daily  . tamsulosin  0.4 mg Oral Daily   acetaminophen **OR** acetaminophen, bisacodyl, ondansetron **OR** ondansetron (ZOFRAN) IV, simethicone  Assessment/ Plan:  84 y.o. male  with a PMHx of BPH, chronic kidney disease stage III, diabetes mellitus type 2, hypertension, hyperlipidemia, who was admitted to [facility] on 07/05/2017 for evaluation of altered mental status.  1.  Hyponatremia, secondary to HCTZ most likely.  2.  CKD stage III. 3.  Hypertension. 4.  Diabetes mellitus type 2 with CKD.  Plan:  Rate of rise of sodium has decreased.  Serum sodium currently 122.  Therefore we will increase 3% saline to 45 cc/h and continue to monitor serum sodium closely.  We may also need to consider ADH antagonist but we will hold off on this for now.  Overall his mental status has improved significantly as compared to admission however.  Further  plan as patient progresses.    LOS: 3 Terriah Reggio 1/26/20198:06 AM

## 2017-07-08 NOTE — Progress Notes (Addendum)
MEDICATION RELATED CONSULT NOTE - INITIAL   Pharmacy Consult for hypertonic saline monitoring Indication: hyponatremia  No Known Allergies  Patient Measurements: Height: 5\' 7"  (170.2 cm) Weight: 180 lb 12.4 oz (82 kg) IBW/kg (Calculated) : 66.1 Adjusted Body Weight: n/a  Vital Signs: Temp: 98.7 F (37.1 C) (01/26 0800) Temp Source: Oral (01/26 0800) BP: 186/71 (01/26 1300) Pulse Rate: 77 (01/26 1300) Intake/Output from previous day: 01/25 0701 - 01/26 0700 In: 1097.9 [P.O.:600; I.V.:497.9] Out: 895 [Urine:895] Intake/Output from this shift: Total I/O In: 360 [P.O.:360] Out: -   Labs: Recent Labs    07/05/17 1742 07/06/17 0445 07/06/17 1002 07/08/17 0147  WBC 7.7 7.7  --  7.6  HGB 12.6* 10.7*  --  10.2*  HCT 35.1* 29.7*  --  29.3*  PLT 290 245  --  223  CREATININE 1.35* 1.47*  --  1.55*  LABCREA  --   --  79  --   MG  --   --   --  1.7  PHOS  --   --   --  3.8  ALBUMIN 3.6  --   --   --   PROT 7.2  --   --   --   AST 141*  --   --   --   ALT 50  --   --   --   ALKPHOS 59  --   --   --   BILITOT 1.0  --   --   --    Estimated Creatinine Clearance: 36.4 mL/min (A) (by C-G formula based on SCr of 1.55 mg/dL (H)).   Medical History: Past Medical History:  Diagnosis Date  . BPH (benign prostatic hyperplasia)   . CKD (chronic kidney disease), stage III ([place])   . Diabetes mellitus without complication ([place])   . Hypercholesteremia   . Hypertension     Medications:  NaCl 3% at 25 mL/hr.  Assessment:  Pharmacy consulted for sodium monitoring in patient admitted with hyponatremia and started on hypertonic saline.   Na+  107 on admission (1/23 @107  @1742 )   Goal of Therapy:  Na Increase ~8 mmol/L in 24 hours  Plan:  Sodium  1/24  0045   109 mmol/L     1/24  0445   112 mmol/L                1/24  1115   111 mmol/L     1/24  1510   113 mmol/L     1/24  1834   113 mmol/L                  1/24  2330   116 mmol/L Rate dec to 20 ml/hr      1/25 0400     119 mmol/L Stopped until 1000                1/25 1219    117 mmol/L Restarted at 25 ml/hr     1/25 1546    118 mmol/L                            1/25 2115    120 mmol/L      1/26 0200    119 mmol/L     1/26 0530    122 mmol/L     1/26 0940    121 mmol/L Rate increased to 45 ml/hr     1/26 1400 126 mmol/L -  rate decreased to 40 ml/hr   Na has increased by 4-5 mmol/L - called Dr Holley Raring and discussed Na trend. MD states he restarted his calculation at 122 mmol/L this AM, will continue on hypertonic saline and watch trend as it has been going up and down. MD orders to decrease drip slightly to 40 ml/hr.  Hypertonic (3%) Saline currently at 40 ml/hr. Continue to monitor sodium levels every 4 hours.   Rocky Morel, PharmD Clinical Pharmacist 07/08/2017 3:06 PM

## 2017-07-08 NOTE — Progress Notes (Signed)
Hypertonic Saline held per MD order until next sodium lab result.

## 2017-07-08 NOTE — Progress Notes (Signed)
[name], NP notified of sodium level at 126. New order to continue infusing 3% hypertonic IVF until sodium level reaches 130.

## 2017-07-08 NOTE — Progress Notes (Addendum)
MEDICATION RELATED CONSULT NOTE - INITIAL   Pharmacy Consult for hypertonic saline monitoring Indication: hyponatremia  No Known Allergies  Patient Measurements: Height: 5\' 7"  (170.2 cm) Weight: 180 lb 12.4 oz (82 kg) IBW/kg (Calculated) : 66.1 Adjusted Body Weight: n/a  Vital Signs: Temp: 98.8 F (37.1 C) (01/26 1405) Temp Source: Oral (01/26 1405) BP: 98/62 (01/26 2000) Pulse Rate: 94 (01/26 2000) Intake/Output from previous day: 01/25 0701 - 01/26 0700 In: 1097.9 [P.O.:600; I.V.:497.9] Out: 895 [Urine:895] Intake/Output from this shift: No intake/output data recorded.  Labs: Recent Labs    07/06/17 0445 07/06/17 1002 07/08/17 0147  WBC 7.7  --  7.6  HGB 10.7*  --  10.2*  HCT 29.7*  --  29.3*  PLT 245  --  223  CREATININE 1.47*  --  1.55*  LABCREA  --  79  --   MG  --   --  1.7  PHOS  --   --  3.8   Estimated Creatinine Clearance: 36.4 mL/min (A) (by C-G formula based on SCr of 1.55 mg/dL (H)).   Medical History: Past Medical History:  Diagnosis Date  . BPH (benign prostatic hyperplasia)   . CKD (chronic kidney disease), stage III ([place])   . Diabetes mellitus without complication ([place])   . Hypercholesteremia   . Hypertension     Medications:  NaCl 3% at 25 mL/hr.  Assessment:  Pharmacy consulted for sodium monitoring in patient admitted with hyponatremia and started on hypertonic saline.   Na+  107 on admission (1/23 @107  @1742 )   Goal of Therapy:  Na Increase ~8 mmol/L in 24 hours  Plan:  Sodium  1/24  0045   109 mmol/L     1/24  0445   112 mmol/L                1/24  1115   111 mmol/L     1/24  1510   113 mmol/L     1/24  1834   113 mmol/L                  1/24  2330   116 mmol/L Rate dec to 20 ml/hr      1/25 0400    119 mmol/L Stopped until 1000                1/25 1219    117 mmol/L Restarted at 25 ml/hr     1/25 1546    118 mmol/L                            1/25 2115    120 mmol/L      1/26 0200    119 mmol/L     1/26 0530    122  mmol/L     1/26 0940    121 mmol/L Rate increased to 45 ml/hr     1/26 1400 126 mmol/L - rate decreased to 40 ml/hr     1/26 1930    126 mmol/L      1/26 2104    125 mmol/L - rate decreased to 35 mL/hr   Na has increased by 4-5 mmol/L - called Dr Holley Raring and discussed Na trend. MD states he restarted his calculation at 122 mmol/L this AM, will continue on hypertonic saline and watch trend as it has been going up and down. MD orders to decrease drip slightly to 40 ml/hr.  Hypertonic (3%) Saline currently  at 40 ml/hr. Continue to monitor sodium levels every 4 hours.   Laural Benes, PharmD Clinical Pharmacist 07/08/2017 8:18 PM

## 2017-07-08 NOTE — Progress Notes (Signed)
MEDICATION RELATED CONSULT NOTE - INITIAL   Pharmacy Consult for hypertonic saline monitoring Indication: hyponatremia  No Known Allergies  Patient Measurements: Height: 5\' 7"  (170.2 cm) Weight: 180 lb 12.4 oz (82 kg) IBW/kg (Calculated) : 66.1 Adjusted Body Weight: n/a  Vital Signs: Temp: 98.7 F (37.1 C) (01/26 0800) Temp Source: Oral (01/26 0800) BP: 199/72 (01/26 0814) Pulse Rate: 77 (01/26 0800) Intake/Output from previous day: 01/25 0701 - 01/26 0700 In: 1097.9 [P.O.:600; I.V.:497.9] Out: 895 [Urine:895] Intake/Output from this shift: Total I/O In: 360 [P.O.:360] Out: -   Labs: Recent Labs    07/05/17 1742 07/06/17 0445 07/06/17 1002 07/08/17 0147  WBC 7.7 7.7  --  7.6  HGB 12.6* 10.7*  --  10.2*  HCT 35.1* 29.7*  --  29.3*  PLT 290 245  --  223  CREATININE 1.35* 1.47*  --  1.55*  LABCREA  --   --  79  --   MG  --   --   --  1.7  PHOS  --   --   --  3.8  ALBUMIN 3.6  --   --   --   PROT 7.2  --   --   --   AST 141*  --   --   --   ALT 50  --   --   --   ALKPHOS 59  --   --   --   BILITOT 1.0  --   --   --    Estimated Creatinine Clearance: 36.4 mL/min (A) (by C-G formula based on SCr of 1.55 mg/dL (H)).   Medical History: Past Medical History:  Diagnosis Date  . BPH (benign prostatic hyperplasia)   . CKD (chronic kidney disease), stage III ([place])   . Diabetes mellitus without complication ([place])   . Hypercholesteremia   . Hypertension     Medications:  NaCl 3% at 25 mL/hr.  Assessment:  Pharmacy consulted for sodium monitoring in patient admitted with hyponatremia and started on hypertonic saline.   Na+  107 on admission (1/23 @107  @1742 )   Goal of Therapy:  Na Increase ~8 mmol/L in 24 hours  Plan:  Sodium  1/24  0045   109 mmol/L     1/24  0445   112 mmol/L                1/24  1115   111 mmol/L     1/24  1510   113 mmol/L     1/24  1834   113 mmol/L                  1/24  2330   116 mmol/L Rate dec to 20 ml/hr      1/25 0400     119 mmol/L Stopped until 1000                1/25 1219    117 mmol/L Restarted at 25 ml/hr     1/25 1546    118 mmol/L                            1/25 2115    120 mmol/L      1/26 0200    119 mmol/L     1/26 0530    122 mmol/L     1/26 0940    121 mmol/L Rate increased to 45 ml/hr   Na has increased by 4-5 mmol/L  in the last 24hr (max increase is 8)  Hypertonic (3%) Saline currently at 45 ml/hr. Continue to monitor sodium levels every 4 hours.   Rocky Morel, PharmD Clinical Pharmacist 07/08/2017 10:37 AM

## 2017-07-08 NOTE — Progress Notes (Signed)
Spoke with William Morse in lab, pt is due for sodium lab draw now and verified that lab is able to see the following sodium lab draw time.

## 2017-07-09 LAB — BASIC METABOLIC PANEL
ANION GAP: 7 (ref 5–15)
Anion gap: 8 (ref 5–15)
BUN: 42 mg/dL — AB (ref 6–20)
BUN: 38 mg/dL — AB (ref 6–20)
CHLORIDE: 99 mmol/L — AB (ref 101–111)
CO2: 23 mmol/L (ref 22–32)
CO2: 22 mmol/L (ref 22–32)
CREATININE: 1.61 mg/dL — AB (ref 0.61–1.24)
Calcium: 8.5 mg/dL — ABNORMAL LOW (ref 8.9–10.3)
Calcium: 8.6 mg/dL — ABNORMAL LOW (ref 8.9–10.3)
Chloride: 96 mmol/L — ABNORMAL LOW (ref 101–111)
Creatinine, Ser: 1.5 mg/dL — ABNORMAL HIGH (ref 0.61–1.24)
GFR calc Af Amer: 44 mL/min — ABNORMAL LOW (ref >60)
GFR calc non Af Amer: 38 mL/min — ABNORMAL LOW (ref >60)
GFR calc non Af Amer: 41 mL/min — ABNORMAL LOW (ref >60)
GFR, EST AFRICAN AMERICAN: 48 mL/min — AB (ref >60)
GLUCOSE: 184 mg/dL — AB (ref 65–99)
Glucose, Bld: 223 mg/dL — ABNORMAL HIGH (ref 65–99)
POTASSIUM: 4.6 mmol/L (ref 3.5–5.1)
Potassium: 4.6 mmol/L (ref 3.5–5.1)
Sodium: 126 mmol/L — ABNORMAL LOW (ref 135–145)
Sodium: 129 mmol/L — ABNORMAL LOW (ref 135–145)

## 2017-07-09 LAB — GLUCOSE, CAPILLARY
GLUCOSE-CAPILLARY: 208 mg/dL — AB (ref 65–99)
Glucose-Capillary: 180 mg/dL — ABNORMAL HIGH (ref 65–99)
Glucose-Capillary: 210 mg/dL — ABNORMAL HIGH (ref 65–99)
Glucose-Capillary: 218 mg/dL — ABNORMAL HIGH (ref 65–99)

## 2017-07-09 LAB — SODIUM
SODIUM: 125 mmol/L — AB (ref 135–145)
SODIUM: 127 mmol/L — AB (ref 135–145)
SODIUM: 127 mmol/L — AB (ref 135–145)
SODIUM: 126 mmol/L — AB (ref 135–145)
Sodium: 126 mmol/L — ABNORMAL LOW (ref 135–145)

## 2017-07-09 LAB — PHOSPHORUS: Phosphorus: 3.5 mg/dL (ref 2.5–4.6)

## 2017-07-09 LAB — MAGNESIUM: Magnesium: 1.8 mg/dL (ref 1.7–2.4)

## 2017-07-09 MED ORDER — HYDRALAZINE HCL 20 MG/ML IJ SOLN
10.0000 mg | Freq: Once | INTRAMUSCULAR | Status: AC
  Administered 2017-07-09: 10 mg via INTRAVENOUS
  Filled 2017-07-09: qty 1

## 2017-07-09 MED ORDER — TOLVAPTAN 15 MG PO TABS
15.0000 mg | ORAL_TABLET | ORAL | Status: DC
  Administered 2017-07-09 – 2017-07-10 (×2): 15 mg via ORAL
  Filled 2017-07-09 (×3): qty 1

## 2017-07-09 NOTE — Progress Notes (Signed)
[facility]  ROUNDING NOTE   Subjective:  Patient seen at bedside. Appears to be doing quite well today. Serum sodium still a bit low. Sodium currently 127. We will attempt the patient on tolvaptan.  Objective:  Vital signs in last 24 hours:  Temp:  [97.9 F (36.6 C)-98.7 F (37.1 C)] 97.9 F (36.6 C) (01/27 0700) Pulse Rate:  [62-96] 80 (01/27 1400) Resp:  [11-27] 19 (01/27 1400) BP: (98-192)/(41-130) 173/86 (01/27 1400) SpO2:  [84 %-98 %] 91 % (01/27 1400) FiO2 (%):  [28 %-36 %] 36 % (01/27 0205)  Weight change:  Filed Weights   07/05/17 1744 07/06/17 0119  Weight: 81.6 kg (180 lb) 82 kg (180 lb 12.4 oz)    Intake/Output: I/O last 3 completed shifts: In: 1320.8 [P.O.:360; I.V.:910.8; IV Piggyback:50] Out: 0814 [GYJEH:6314]   Intake/Output this shift:  Total I/O In: 790 [P.O.:240; Other:550] Out: -   Physical Exam: General: No acute distress  Head: Normocephalic, atraumatic. Moist oral mucosal membranes  Eyes: Anicteric  Neck: Supple, trachea midline  Lungs:  Clear to auscultation, normal effort  Heart: S1S2 no rubs  Abdomen:  Soft, nontender, bowel sounds present  Extremities: Trace peripheral edema.  Neurologic: Awake, alert, following commands  Skin: No lesions       Basic Metabolic Panel: Recent Labs  Lab 07/05/17 1742  07/06/17 0445  07/08/17 0147  07/08/17 2104 07/09/17 0213 07/09/17 0459 07/09/17 0906 07/09/17 1357  NA 107*   < > 112*   < > 119*   < > 125* 125* 126* 126* 127*  K 4.3  --  3.7  --  3.8  --   --   --  4.6  --   --   CL 74*  --  82*  --  90*  --   --   --  96*  --   --   CO2 22  --  23  --  20*  --   --   --  23  --   --   GLUCOSE 204*  --  88  --  142*  --   --   --  184*  --   --   BUN 37*  --  40*  --  44*  --   --   --  42*  --   --   CREATININE 1.35*  --  1.47*  --  1.55*  --   --   --  1.61*  --   --   CALCIUM 7.9*  --  7.6*  --  7.9*  --   --   --  8.5*  --   --   MG  --   --   --   --  1.7  --   --   --   1.8  --   --   PHOS  --   --   --   --  3.8  --   --   --  3.5  --   --    < > = values in this interval not displayed.    Liver Function Tests: Recent Labs  Lab 07/05/17 1742  AST 141*  ALT 50  ALKPHOS 59  BILITOT 1.0  PROT 7.2  ALBUMIN 3.6   No results for input(s): LIPASE, AMYLASE in the last 168 hours. No results for input(s): AMMONIA in the last 168 hours.  CBC: Recent Labs  Lab 07/05/17 1742 07/06/17 0445 07/08/17 0147  WBC 7.7  7.7 7.6  HGB 12.6* 10.7* 10.2*  HCT 35.1* 29.7* 29.3*  MCV 89.8 89.7 93.4  PLT 290 245 223    Cardiac Enzymes: Recent Labs  Lab 07/05/17 1742 07/06/17 0047 07/06/17 0445 07/06/17 1247  TROPONINI 0.23* 0.23* 0.22* 0.22*    BNP: Invalid input(s): POCBNP  CBG: Recent Labs  Lab 07/08/17 1656 07/08/17 2206 07/08/17 2332 07/09/17 0740 07/09/17 1140  GLUCAP 110* 223* 233* 180* 210*    Microbiology: Results for orders placed or performed during the hospital encounter of 07/05/17  Blood culture (routine x 2)     Status: None (Preliminary result)   Collection Time: 07/05/17  9:45 PM  Result Value Ref Range Status   Specimen Description BLOOD LT HAND  Final   Special Requests   Final    BOTTLES DRAWN AEROBIC AND ANAEROBIC Blood Culture adequate volume   Culture   Final    NO GROWTH 4 DAYS Performed at [facility], [address], [place], [place] [address]    Report Status PENDING  Incomplete  Blood culture (routine x 2)     Status: None (Preliminary result)   Collection Time: 07/05/17  9:45 PM  Result Value Ref Range Status   Specimen Description BLOOD RT [facility]  Final   Special Requests   Final    BOTTLES DRAWN AEROBIC AND ANAEROBIC Blood Culture adequate volume   Culture   Final    NO GROWTH 4 DAYS Performed at [facility], [address], [place], [place] [address]    Report Status PENDING  Incomplete  MRSA PCR Screening     Status: None   Collection Time: 07/06/17  7:16 AM  Result Value Ref  Range Status   MRSA by PCR NEGATIVE NEGATIVE Final    Comment:        The GeneXpert MRSA Assay (FDA approved for NASAL specimens only), is one component of a comprehensive MRSA colonization surveillance program. It is not intended to diagnose MRSA infection nor to guide or monitor treatment for MRSA infections. Performed at [facility], [address] [place], [place] [address]     Coagulation Studies: No results for input(s): LABPROT, INR in the last 72 hours.  Urinalysis: No results for input(s): COLORURINE, LABSPEC, PHURINE, GLUCOSEU, HGBUR, BILIRUBINUR, KETONESUR, PROTEINUR, UROBILINOGEN, NITRITE, LEUKOCYTESUR in the last 72 hours.  Invalid input(s): APPERANCEUR    Imaging: Dg Abd 1 View  Result Date: 07/08/2017 CLINICAL DATA:  Abdominal distention EXAM: ABDOMEN - 1 VIEW COMPARISON:  07/06/2017 FINDINGS: Scattered gas within the colon with mild transverse colonic distention. Changes likely to represent ileus. No small bowel distention. No radiopaque stones. Degenerative changes in the spine. Vascular calcifications. IMPRESSION: Mildly distended gas-filled colon most likely to represent ileus. No evidence of small bowel obstruction. Electronically Signed   By: Lucienne Capers M.D.   On: 07/08/2017 05:30     Medications:   . cefTRIAXone (ROCEPHIN) IVPB 1 gram/50 mL D5W Stopped (07/08/17 1758)  . sodium chloride (hypertonic) 45 mL/hr (07/09/17 1019)   . amLODipine  10 mg Oral QPM  . aspirin EC  81 mg Oral Daily  . atorvastatin  40 mg Oral q1800  . budesonide (PULMICORT) nebulizer solution  0.5 mg Nebulization BID  . enoxaparin (LOVENOX) injection  40 mg Subcutaneous Q24H  . hydrALAZINE  10 mg Intravenous Once  . insulin aspart  0-5 Units Subcutaneous QHS  . insulin aspart  0-9 Units Subcutaneous TID WC  . ipratropium-albuterol  3 mL Nebulization Q6H  .  metoprolol succinate  100 mg Oral Daily  . polyethylene glycol  17 g Oral Daily  . sertraline  50 mg  Oral Daily  . tamsulosin  0.4 mg Oral Daily   acetaminophen **OR** acetaminophen, bisacodyl, hydrALAZINE, ondansetron **OR** ondansetron (ZOFRAN) IV, simethicone  Assessment/ Plan:  84 y.o. male  with a PMHx of BPH, chronic kidney disease stage III, diabetes mellitus type 2, hypertension, hyperlipidemia, who was admitted to [facility] on 07/05/2017 for evaluation of altered mental status.  1.  Hyponatremia, secondary to HCTZ most likely.  2.  CKD stage III. 3.  Hypertension. 4.  Diabetes mellitus type 2 with CKD.  Plan:  The patient serum sodium has corrected over the past several days.  Serum sodium is currently 127.  We will now discontinue 3% saline and transition the patient to tolvaptan and continue to monitor serum sodium closely.  Otherwise the patient appears to be doing well.  Continue amlodipine, hydralazine, metoprolol for blood pressure control.  Further plan as patient progresses.   LOS: 4 Raylan Troiani 1/27/20192:46 PM

## 2017-07-09 NOTE — Progress Notes (Signed)
Pharmacy Antibiotic Note  William Morse is a 84 y.o. male admitted on 07/05/2017 with pneumonia.  Pharmacy has been consulted for ceftriaxone dosing.   Plan: Ceftriaxone 1 gram q 24 hours ordered.  Height: 5\' 7"  (170.2 cm) Weight: 180 lb 12.4 oz (82 kg) IBW/kg (Calculated) : 66.1  Temp (24hrs), Avg:98.6 F (37 C), Min:98.3 F (36.8 C), Max:98.8 F (37.1 C)  Recent Labs  Lab 07/05/17 1742 07/06/17 0445 07/08/17 0147 07/09/17 0459  WBC 7.7 7.7 7.6  --   CREATININE 1.35* 1.47* 1.55* 1.61*    Estimated Creatinine Clearance: 35 mL/min (A) (by C-G formula based on SCr of 1.61 mg/dL (H)).    No Known Allergies  Antimicrobials this admission: Ceftriaxone 1/23 azithromycin 1/23  >> 1/26  Dose adjustments this admission:  Microbiology results: 1/23 BCx: NG TD 1/24 MRSA PCR (-)   1/23 CXR: edema vs. atelectasis vs. atypical infection  Thank you for allowing pharmacy to be a part of this patient's care.  Rocky Morel, PharmD, BCPS Clinical Pharmacist 07/09/2017 7:51 AM

## 2017-07-09 NOTE — Progress Notes (Addendum)
MEDICATION RELATED CONSULT NOTE - INITIAL   Pharmacy Consult for hypertonic saline monitoring Indication: hyponatremia  No Known Allergies  Patient Measurements: Height: 5\' 7"  (170.2 cm) Weight: 180 lb 12.4 oz (82 kg) IBW/kg (Calculated) : 66.1 Adjusted Body Weight: n/a  Vital Signs: Temp: 97.9 F (36.6 C) (01/27 0700) BP: 173/86 (01/27 1400) Pulse Rate: 80 (01/27 1400) Intake/Output from previous day: 01/26 0701 - 01/27 0700 In: 1045.8 [P.O.:360; I.V.:635.8; IV Piggyback:50] Out: 850 [Urine:850] Intake/Output from this shift: Total I/O In: 1262.5 [P.O.:240; I.V.:472.5; Other:550] Out: -   Labs: Recent Labs    07/08/17 0147 07/09/17 0459 07/09/17 1508  WBC 7.6  --   --   HGB 10.2*  --   --   HCT 29.3*  --   --   PLT 223  --   --   CREATININE 1.55* 1.61* 1.50*  MG 1.7 1.8  --   PHOS 3.8 3.5  --    Estimated Creatinine Clearance: 37.6 mL/min (A) (by C-G formula based on SCr of 1.5 mg/dL (H)).   Medical History: Past Medical History:  Diagnosis Date  . BPH (benign prostatic hyperplasia)   . CKD (chronic kidney disease), stage III ([place])   . Diabetes mellitus without complication ([place])   . Hypercholesteremia   . Hypertension     Medications:  NaCl 3% at 25 mL/hr.  Assessment:  Pharmacy consulted for sodium monitoring in patient admitted with hyponatremia and started on hypertonic saline.   Na+  107 on admission (1/23 @107  @1742 )   Goal of Therapy:  Na Increase ~8 mmol/L in 24 hours  Plan:  Sodium  1/24  0045   109 mmol/L     1/24  0445   112 mmol/L                1/24  1115   111 mmol/L     1/24  1510   113 mmol/L     1/24  1834   113 mmol/L                  1/24  2330   116 mmol/L Rate dec to 20 ml/hr      1/25 0400    119 mmol/L Stopped until 1000                1/25 1219    117 mmol/L Restarted at 25 ml/hr     1/25 1546    118 mmol/L                            1/25 2115    120 mmol/L      1/26 0200    119 mmol/L     1/26 0530    122  mmol/L     1/26 0940    121 mmol/L Rate increased to 45 ml/hr     1/26 1400 126 mmol/L - rate decreased to 40 ml/hr     1/26 2100 125 mmol/l     1/27 0200 125 mmol/L ? to 45 mL/hr     1/27 0500 126 mmol/L     1/27 0900  126 mmol/L     1/27 1400  127 mmol/L     1/27 2300 126 mmol/L     1/28 0430 126 mmol/L   Rocky Morel, PharmD Clinical Pharmacist 07/09/2017 4:09 PM

## 2017-07-09 NOTE — Progress Notes (Signed)
Spoke with Dr Jefferson Fuel regarding patients BP. Administered PRN hydralazine this am. BP continues to be elevated. Orders entered for hydralazine IV 10 mg once.

## 2017-07-09 NOTE — Progress Notes (Signed)
Notified [name], NP of patient's elevated BP. See new order entered for PRN Hydralazine.

## 2017-07-09 NOTE — Plan of Care (Signed)
A&O patient. Denies pain this shift. Q4 hour sodium levels continue until sodium level is WNL. 3% hypertonic IVF restarted this shift at 35 ml/hr, per Burman Nieves, NP. The last 2 sodium levels were 125, resulting in increasing the 3% hypertonic IVF rate to 45 ml/hr.  Pt's BP elevated this shift, NP notified and new order entered.  PRN Hydralazine given once this shift with improvement noted. Pt's abdomen continues to be distended and taut, although the pt has had a BM the past 2 evenings. Bowel sounds noted: high pictched and hypoactive this shift. PRN Dulcolax given this shift. Also PRN Simethicone given this shift with improvement reported. Pt continues to require oxygen via [place] to maintain oxygen saturation, pt denies shortness of breath, expiratory wheezes noted on assessment with scheduled breathing treatments ordered.

## 2017-07-09 NOTE — Progress Notes (Signed)
[name], NP notified sodium level at 126. No new orders at this time.

## 2017-07-09 NOTE — Progress Notes (Signed)
Notified [name], NP of patient's last sodium level at 125. New order to increase 3% hypertonic IVF to 45 mll/hr.

## 2017-07-09 NOTE — Progress Notes (Signed)
[place] at [place] NAME: William Morse    MR#:  [id number]  DATE OF BIRTH:  09/30/1930  SUBJECTIVE:  CHIEF COMPLAINT:   Chief Complaint  Patient presents with  . Altered Mental Status   Patient feeling better sodium continues to remain low  REVIEW OF SYSTEMS:  Review of Systems  Constitutional: Negative for chills, fever and weight loss.  HENT: Negative for nosebleeds and sore throat.   Eyes: Negative for blurred vision.  Respiratory: Negative for cough, shortness of breath and wheezing.   Cardiovascular: Negative for chest pain, orthopnea, leg swelling and PND.  Gastrointestinal: Negative for abdominal pain, constipation, diarrhea, heartburn, nausea and vomiting.  Genitourinary: Negative for dysuria and urgency.  Musculoskeletal: Negative for back pain.  Skin: Negative for rash.  Neurological: Negative for dizziness, speech change, focal weakness and headaches.  Endo/Heme/Allergies: Does not bruise/bleed easily.  Psychiatric/Behavioral: Negative for depression.    DRUG ALLERGIES:  No Known Allergies VITALS:  Blood pressure (!) 173/86, pulse 80, temperature 97.9 F (36.6 C), resp. rate 19, height 5\' 7"  (1.702 m), weight 180 lb 12.4 oz (82 kg), SpO2 91 %. PHYSICAL EXAMINATION:  Physical Exam  Constitutional: He is oriented to person, place, and time and well-developed, well-nourished, and in no distress.  HENT:  Head: Normocephalic and atraumatic.  Eyes: Conjunctivae and EOM are normal. Pupils are equal, round, and reactive to light.  Neck: Normal range of motion. Neck supple. No tracheal deviation present. No thyromegaly present.  Cardiovascular: Normal rate, regular rhythm and normal heart sounds.  Pulmonary/Chest: Effort normal and breath sounds normal. No respiratory distress. He has no wheezes. He exhibits no tenderness.  Abdominal: Soft. Bowel sounds are normal. He exhibits no distension. There is no tenderness.    Musculoskeletal: Normal range of motion.  Neurological: He is alert and oriented to person, place, and time. No cranial nerve deficit.  Skin: Skin is warm and dry. No rash noted.  Psychiatric: Mood and affect normal.   LABORATORY PANEL:  Male CBC Recent Labs  Lab 07/08/17 0147  WBC 7.6  HGB 10.2*  HCT 29.3*  PLT 223   ------------------------------------------------------------------------------------------------------------------ Chemistries  Recent Labs  Lab 07/05/17 1742  07/09/17 0459  07/09/17 1357  NA 107*   < > 126*   < > 127*  K 4.3   < > 4.6  --   --   CL 74*   < > 96*  --   --   CO2 22   < > 23  --   --   GLUCOSE 204*   < > 184*  --   --   BUN 37*   < > 42*  --   --   CREATININE 1.35*   < > 1.61*  --   --   CALCIUM 7.9*   < > 8.5*  --   --   MG  --    < > 1.8  --   --   AST 141*  --   --   --   --   ALT 50  --   --   --   --   ALKPHOS 59  --   --   --   --   BILITOT 1.0  --   --   --   --    < > = values in this interval not displayed.   RADIOLOGY:  No results found. ASSESSMENT AND PLAN:  84 y.o. male with a  PMHx of BPH, chronic kidney disease stage III, diabetes mellitus type 2, hypertension, hyperlipidemia, who was admitted to [facility] on 07/05/2017 for evaluation of altered mental status  * Acute hyponatremia - likely secondary to HCTZ  -Sodium continuing to improve -Follow closely -Appreciate nephrology input  *  CAP (community acquired pneumonia) -continue Rocephin + Zithromax   * HTN (hypertension) -continue Norvasc, Metoprolol  * Diabetes ([place]) -sliding scale insulin with corresponding glucose checks  * HLD (hyperlipidemia) - continue lipitor  * BPH (benign prostatic hyperplasia) -continue Flomax  * CKD (chronic kidney disease), stage III ([place]) -at baseline, monitor and avoid nephrotoxins        All the records are reviewed and case discussed with Care Management/Social Worker. Management plans discussed with the patient, family and  they are in agreement.  CODE STATUS: Full Code  TOTAL TIME TAKING CARE OF THIS PATIENT: 15 minutes.   More than 50% of the time was spent in counseling/coordination of care: YES  POSSIBLE D/C IN 2-3 DAYS, DEPENDING ON CLINICAL CONDITION.   Dustin Flock M.D on 07/09/2017 at 3:09 PM  Between 7am to 6pm - Pager - [phone]  After 6pm go to [email or web address] - Proofreader  [facility]  Office  [phone]  CC: Primary care physician; Leonel Ramsay, MD  Note: This dictation was prepared with Dragon dictation along with smaller phrase technology. Any transcriptional errors that result from this process are unintentional.

## 2017-07-09 NOTE — Progress Notes (Signed)
MEDICATION RELATED CONSULT NOTE - INITIAL   Pharmacy Consult for hypertonic saline monitoring Indication: hyponatremia  No Known Allergies  Patient Measurements: Height: 5\' 7"  (170.2 cm) Weight: 180 lb 12.4 oz (82 kg) IBW/kg (Calculated) : 66.1 Adjusted Body Weight: n/a  Vital Signs: Temp: 98.7 F (37.1 C) (01/27 0120) Temp Source: Oral (01/27 0120) BP: 105/53 (01/27 0700) Pulse Rate: 62 (01/27 0700) Intake/Output from previous day: 01/26 0701 - 01/27 0700 In: 1045.8 [P.O.:360; I.V.:635.8; IV Piggyback:50] Out: 850 [Urine:850] Intake/Output from this shift: No intake/output data recorded.  Labs: Recent Labs    07/06/17 1002 07/08/17 0147 07/09/17 0459  WBC  --  7.6  --   HGB  --  10.2*  --   HCT  --  29.3*  --   PLT  --  223  --   CREATININE  --  1.55* 1.61*  LABCREA 79  --   --   MG  --  1.7 1.8  PHOS  --  3.8 3.5   Estimated Creatinine Clearance: 35 mL/min (A) (by C-G formula based on SCr of 1.61 mg/dL (H)).   Medical History: Past Medical History:  Diagnosis Date  . BPH (benign prostatic hyperplasia)   . CKD (chronic kidney disease), stage III ([place])   . Diabetes mellitus without complication ([place])   . Hypercholesteremia   . Hypertension     Medications:  NaCl 3% at 25 mL/hr.  Assessment:  Pharmacy consulted for sodium monitoring in patient admitted with hyponatremia and started on hypertonic saline.   Na+  107 on admission (1/23 @107  @1742 )   Goal of Therapy:  Na Increase ~8 mmol/L in 24 hours  Plan:  Sodium  1/24  0045   109 mmol/L     1/24  0445   112 mmol/L                1/24  1115   111 mmol/L     1/24  1510   113 mmol/L     1/24  1834   113 mmol/L                  1/24  2330   116 mmol/L Rate dec to 20 ml/hr      1/25 0400    119 mmol/L Stopped until 1000                1/25 1219    117 mmol/L Restarted at 25 ml/hr     1/25 1546    118 mmol/L                            1/25 2115    120 mmol/L      1/26 0200    119 mmol/L     1/26  0530    122 mmol/L     1/26 0940    121 mmol/L Rate increased to 45 ml/hr     1/26 1400 126 mmol/L - rate decreased to 40 ml/hr     1/26 2100 125 mmol/l     1/27 0200 125 mmol/L ? to 45 mL/hr     1/27 0500 126 mmol/L     1/27 0900  126 mmol/L    Hypertonic (3%) Saline currently at 45 ml/hr. Continue to monitor sodium levels every 4 hours.   Rocky Morel, PharmD Clinical Pharmacist 07/09/2017 7:52 AM

## 2017-07-09 NOTE — Progress Notes (Signed)
[facility] Progess Note    SYNOPSIS   84 yo male admitted with acute encephalopathy secondary to severe hyponatremia and acute hypoxic respiratory failure secondary to pulmonary edema vs. atypical infection  ASSESSMENT/PLAN   Hyponatremia. Patient presently getting 3% saline. Minimal sodium increased to 126. Appreciate nephrology's input, 3% sodium is presently being infused at 45 mL per hour. Will follow frequent sodium checks.   Renal insufficiency. Has been rehydrated, ultrasound did not reveal any evidence of hydronephrosis, presently on 3% saline. BUN/creatinine slightly worsening  nephrology following   INTAKE / OUTPUT:  Intake/Output Summary (Last 24 hours) at 07/09/2017 0937 Last data filed at 07/09/2017 0500 Gross per 24 hour  Intake 1045.75 ml  Output 850 ml  Net 195.75 ml     Name: William Morse MRN: [id number] DOB: 27-Oct-1930    ADMISSION DATE:  07/05/2017  SUBJECTIVE:   Agent is awake, alert, somewhat confused, voices no complaints at this time. Presently getting a breathing treatment   VITAL SIGNS: Temp:  [97.9 F (36.6 C)-98.8 F (37.1 C)] 97.9 F (36.6 C) (01/27 0700) Pulse Rate:  [62-96] 62 (01/27 0700) Resp:  [11-27] 11 (01/27 0700) BP: (98-190)/(41-177) 186/71 (01/27 0915) SpO2:  [84 %-98 %] 95 % (01/27 0700) FiO2 (%):  [28 %-36 %] 36 % (01/27 0205)   PHYSICAL EXAMINATION: Physical Examination:   VS: BP (!) 186/71   Pulse 62   Temp 97.9 F (36.6 C)   Resp 11   Ht 5\' 7"  (1.702 m)   Wt 180 lb 12.4 oz (82 kg)   SpO2 95%   BMI 28.31 kg/m   General Appearance: No distress  Neuro:without focal findings, alert, pleasant and responsive, somewhat confused HEENT: PERRLA, EOM intact. Pulmonary: normal breath sounds   CardiovascularNormal S1,S2.  No m/r/g.   Abdomen: Benign, Soft, non-tender. Skin:   warm, no rashes, no ecchymosis  Extremities: normal, no cyanosis, clubbing.  LABORATORY PANEL:   CBC Recent Labs    Lab 07/08/17 0147  WBC 7.6  HGB 10.2*  HCT 29.3*  PLT 223    Chemistries  Recent Labs  Lab 07/05/17 1742  07/09/17 0459  NA 107*   < > 126*  K 4.3   < > 4.6  CL 74*   < > 96*  CO2 22   < > 23  GLUCOSE 204*   < > 184*  BUN 37*   < > 42*  CREATININE 1.35*   < > 1.61*  CALCIUM 7.9*   < > 8.5*  MG  --    < > 1.8  PHOS  --    < > 3.5  AST 141*  --   --   ALT 50  --   --   ALKPHOS 59  --   --   BILITOT 1.0  --   --    < > = values in this interval not displayed.    Recent Labs  Lab 07/08/17 0736 07/08/17 1149 07/08/17 1656 07/08/17 2206 07/08/17 2332 07/09/17 0740  GLUCAP 169* 209* 110* 223* 233* 180*   No results for input(s): PHART, PCO2ART, PO2ART in the last 168 hours. Recent Labs  Lab 07/05/17 1742  AST 141*  ALT 50  ALKPHOS 59  BILITOT 1.0  ALBUMIN 3.6    Cardiac Enzymes Recent Labs  Lab 07/06/17 1247  TROPONINI 0.22*    RADIOLOGY:  Dg Abd 1 View  Result Date: 07/08/2017 CLINICAL DATA:  Abdominal distention EXAM: ABDOMEN - 1 VIEW  COMPARISON:  07/06/2017 FINDINGS: Scattered gas within the colon with mild transverse colonic distention. Changes likely to represent ileus. No small bowel distention. No radiopaque stones. Degenerative changes in the spine. Vascular calcifications. IMPRESSION: Mildly distended gas-filled colon most likely to represent ileus. No evidence of small bowel obstruction. Electronically Signed   By: Lucienne Capers M.D.   On: 07/08/2017 05:30     Hermelinda Dellen, DO  1/27/2019Patient ID: William Morse, male   DOB: January 07, 1931, 84 y.o.   MRN: [id number] Patient ID: William Morse, male   DOB: 09-25-1930, 84 y.o.   MRN: [id number] Patient ID: William Morse, male   DOB: Jun 24, 1930, 84 y.o.   MRN: [id number]

## 2017-07-09 NOTE — Progress Notes (Addendum)
MEDICATION RELATED CONSULT NOTE - INITIAL   Pharmacy Consult for hypertonic saline monitoring Indication: hyponatremia  No Known Allergies  Patient Measurements: Height: 5\' 7"  (170.2 cm) Weight: 180 lb 12.4 oz (82 kg) IBW/kg (Calculated) : 66.1 Adjusted Body Weight: n/a  Vital Signs: Temp: 98.7 F (37.1 C) (01/27 0120) Temp Source: Oral (01/27 0120) BP: 170/58 (01/27 0245) Pulse Rate: 80 (01/27 0245) Intake/Output from previous day: 01/26 0701 - 01/27 0700 In: 933.6 [P.O.:360; I.V.:523.6; IV Piggyback:50] Out: 820 [Urine:820] Intake/Output from this shift: Total I/O In: 154.1 [I.V.:154.1] Out: 820 [Urine:820]  Labs: Recent Labs    07/06/17 0445 07/06/17 1002 07/08/17 0147  WBC 7.7  --  7.6  HGB 10.7*  --  10.2*  HCT 29.7*  --  29.3*  PLT 245  --  223  CREATININE 1.47*  --  1.55*  LABCREA  --  79  --   MG  --   --  1.7  PHOS  --   --  3.8   Estimated Creatinine Clearance: 36.4 mL/min (A) (by C-G formula based on SCr of 1.55 mg/dL (H)).   Medical History: Past Medical History:  Diagnosis Date  . BPH (benign prostatic hyperplasia)   . CKD (chronic kidney disease), stage III ([place])   . Diabetes mellitus without complication ([place])   . Hypercholesteremia   . Hypertension     Medications:  NaCl 3% at 25 mL/hr.  Assessment:  Pharmacy consulted for sodium monitoring in patient admitted with hyponatremia and started on hypertonic saline.   Na+  107 on admission (1/23 @107  @1742 )   Goal of Therapy:  Na Increase ~8 mmol/L in 24 hours  Plan:  Sodium  1/24  0045   109 mmol/L     1/24  0445   112 mmol/L                1/24  1115   111 mmol/L     1/24  1510   113 mmol/L     1/24  1834   113 mmol/L                  1/24  2330   116 mmol/L Rate dec to 20 ml/hr      1/25 0400    119 mmol/L Stopped until 1000                1/25 1219    117 mmol/L Restarted at 25 ml/hr     1/25 1546    118 mmol/L                            1/25 2115    120 mmol/L      1/26  0200    119 mmol/L     1/26 0530    122 mmol/L     1/26 0940    121 mmol/L Rate increased to 45 ml/hr     1/26 1400 126 mmol/L - rate decreased to 40 ml/hr     1/26 2100 125 mmol/l     1/27 0200 125 mmol/L ? to 45 mL/hr     1/27 0500 126 mmol/L   Na has increased by 4-5 mmol/L - called Dr Holley Raring and discussed Na trend. MD states he restarted his calculation at 122 mmol/L this AM, will continue on hypertonic saline and watch trend as it has been going up and down. MD orders to decrease drip slightly to 40 ml/hr.  Hypertonic (3%) Saline currently at 40 ml/hr. Continue to monitor sodium levels every 4 hours.   Eloise Harman, PharmD Clinical Pharmacist 07/09/2017 3:59 AM

## 2017-07-10 ENCOUNTER — Inpatient Hospital Stay: Payer: PPO

## 2017-07-10 DIAGNOSIS — E222 Syndrome of inappropriate secretion of antidiuretic hormone: Principal | ICD-10-CM

## 2017-07-10 DIAGNOSIS — K567 Ileus, unspecified: Secondary | ICD-10-CM

## 2017-07-10 LAB — CULTURE, BLOOD (ROUTINE X 2)
Culture: NO GROWTH
Culture: NO GROWTH
Special Requests: ADEQUATE
Special Requests: ADEQUATE

## 2017-07-10 LAB — SODIUM
SODIUM: 126 mmol/L — AB (ref 135–145)
SODIUM: 125 mmol/L — AB (ref 135–145)
Sodium: 126 mmol/L — ABNORMAL LOW (ref 135–145)

## 2017-07-10 LAB — GLUCOSE, CAPILLARY
GLUCOSE-CAPILLARY: 228 mg/dL — AB (ref 65–99)
GLUCOSE-CAPILLARY: 221 mg/dL — AB (ref 65–99)
GLUCOSE-CAPILLARY: 212 mg/dL — AB (ref 65–99)

## 2017-07-10 MED ORDER — HYDRALAZINE HCL 20 MG/ML IJ SOLN
10.0000 mg | Freq: Once | INTRAMUSCULAR | Status: AC
  Administered 2017-07-10: 10 mg via INTRAVENOUS
  Filled 2017-07-10: qty 1

## 2017-07-10 MED ORDER — IPRATROPIUM-ALBUTEROL 0.5-2.5 (3) MG/3ML IN SOLN: 3.0000 mL | RESPIRATORY_TRACT | Status: DC | PRN

## 2017-07-10 MED ORDER — SODIUM CHLORIDE 0.9 % IV SOLN
INTRAVENOUS | Status: DC
  Administered 2017-07-10: 17:00:00 via INTRAVENOUS

## 2017-07-10 MED ORDER — BISACODYL 10 MG RE SUPP
10.0000 mg | Freq: Once | RECTAL | Status: AC
  Administered 2017-07-10: 10 mg via RECTAL
  Filled 2017-07-10: qty 1

## 2017-07-10 MED ORDER — METOPROLOL TARTRATE 5 MG/5ML IV SOLN: 2.5000 mg | INTRAVENOUS | Status: DC | PRN

## 2017-07-10 MED ORDER — HYDRALAZINE HCL 20 MG/ML IJ SOLN
10.0000 mg | INTRAMUSCULAR | Status: DC | PRN
  Administered 2017-07-10 – 2017-07-11 (×4): 20 mg via INTRAVENOUS
  Filled 2017-07-10 (×4): qty 1

## 2017-07-10 NOTE — Progress Notes (Signed)
[place] at [place] NAME: Kasper Mudrick    MR#:  [id number]  DATE OF BIRTH:  [birth year]  SUBJECTIVE:  CHIEF COMPLAINT:   Chief Complaint  Patient presents with  . Altered Mental Status   Pt c/o sob, and abdominal distention  REVIEW OF SYSTEMS:  Review of Systems  Constitutional: Negative for chills, fever and weight loss.  HENT: Negative for nosebleeds and sore throat.   Eyes: Negative for blurred vision.  Respiratory: Positive for shortness of breath. Negative for cough and wheezing.   Cardiovascular: Negative for chest pain, orthopnea, leg swelling and PND.  Gastrointestinal: Negative for abdominal pain, constipation, diarrhea, heartburn, nausea and vomiting.  Genitourinary: Negative for dysuria and urgency.  Musculoskeletal: Negative for back pain.  Skin: Negative for rash.  Neurological: Negative for dizziness, speech change, focal weakness and headaches.  Endo/Heme/Allergies: Does not bruise/bleed easily.  Psychiatric/Behavioral: Negative for depression.    DRUG ALLERGIES:  No Known Allergies VITALS:  Blood pressure (!) 157/50, pulse 89, temperature 98.1 F (36.7 C), temperature source Oral, resp. rate 14, height 5\' 7"  (1.702 m), weight 180 lb 12.4 oz (82 kg), SpO2 100 %. PHYSICAL EXAMINATION:  Physical Exam  Constitutional: He is oriented to person, place, and time and well-developed, well-nourished, and in no distress.  HENT:  Head: Normocephalic and atraumatic.  Eyes: Conjunctivae and EOM are normal. Pupils are equal, round, and reactive to light.  Neck: Normal range of motion. Neck supple. No tracheal deviation present. No thyromegaly present.  Cardiovascular: Normal rate, regular rhythm and normal heart sounds.  Pulmonary/Chest: Effort normal and breath sounds normal. No respiratory distress. He has no wheezes. He exhibits no tenderness.  Abdominal: Soft. Bowel sounds are normal. He exhibits distension. There is no  tenderness.  Musculoskeletal: Normal range of motion.  Neurological: He is alert and oriented to person, place, and time. No cranial nerve deficit.  Skin: Skin is warm and dry. No rash noted.  Psychiatric: Mood and affect normal.   LABORATORY PANEL:  Male CBC Recent Labs  Lab 07/08/17 0147  WBC 7.6  HGB 10.2*  HCT 29.3*  PLT 223   ------------------------------------------------------------------------------------------------------------------ Chemistries  Recent Labs  Lab 07/05/17 1742  07/09/17 0459  07/09/17 1508  07/10/17 0920  NA 107*   < > 126*   < > 129*  127*   < > 125*  K 4.3   < > 4.6  --  4.6  --   --   CL 74*   < > 96*  --  99*  --   --   CO2 22   < > 23  --  22  --   --   GLUCOSE 204*   < > 184*  --  223*  --   --   BUN 37*   < > 42*  --  38*  --   --   CREATININE 1.35*   < > 1.61*  --  1.50*  --   --   CALCIUM 7.9*   < > 8.5*  --  8.6*  --   --   MG  --    < > 1.8  --   --   --   --   AST 141*  --   --   --   --   --   --   ALT 50  --   --   --   --   --   --  ALKPHOS 59  --   --   --   --   --   --   BILITOT 1.0  --   --   --   --   --   --    < > = values in this interval not displayed.   RADIOLOGY:  Dg Abd 1 View  Result Date: 07/10/2017 CLINICAL DATA:  Abdominal distention and ileus. EXAM: ABDOMEN - 1 VIEW COMPARISON:  07/08/2017 FINDINGS: Similar pattern of mildly distended and gas-filled colon suggestive of mild colonic ileus. Some gaseous distention of the stomach also present. No gross signs of free air. No significant small bowel dilatation. IMPRESSION: Similar mildly distended gas-filled colon is likely consistent with mild colonic ileus. Electronically Signed   By: Aletta Edouard M.D.   On: 07/10/2017 09:20   ASSESSMENT AND PLAN:  84 y.o. male with a PMHx of BPH, chronic kidney disease stage III, diabetes mellitus type 2, hypertension, hyperlipidemia, who was admitted to [facility] on 07/05/2017 for evaluation of altered mental status  * Acute  hyponatremia - likely secondary to HCTZ  -Sodium continuing to improve -Follow closely -Appreciate nephrology input -Continue trovaptan  *Abdominal distention likely due to partial small bowel obstruction NG trial  *  CAP (community acquired pneumonia) -continue Rocephin + Zithromax   * HTN (hypertension) -continue Norvasc, Metoprolol  * Diabetes ([place]) -continue sliding scale insulin with corresponding glucose checks  * HLD (hyperlipidemia) - continue lipitor  * BPH (benign prostatic hyperplasia) -continue Flomax  * CKD (chronic kidney disease), stage III ([facility]) -at baseline, monitor and avoid nephrotoxins        All the records are reviewed and case discussed with Care Management/Social Worker. Management plans discussed with the patient, family and they are in agreement.  CODE STATUS: Full Code  TOTAL TIME TAKING CARE OF THIS PATIENT: 15 minutes.   More than 50% of the time was spent in counseling/coordination of care: YES  POSSIBLE D/C IN 2-3 DAYS, DEPENDING ON CLINICAL CONDITION.   Dustin Flock M.D on 07/10/2017 at 5:43 PM  Between 7am to 6pm - Pager - [phone]  After 6pm go to [email or web address] - Proofreader  [facility]  Office  [phone]  CC: Primary care physician; Leonel Ramsay, MD  Note: This dictation was prepared with Dragon dictation along with smaller phrase technology. Any transcriptional errors that result from this process are unintentional.

## 2017-07-10 NOTE — Progress Notes (Signed)
Inpatient Diabetes Program Recommendations  AACE/ADA: New Consensus Statement on Inpatient Glycemic Control (2015)  Target Ranges:  Prepandial:   less than 140 mg/dL      Peak postprandial:   less than 180 mg/dL (1-2 hours)      Critically ill patients:  140 - 180 mg/dL  Results for KASSIUS, BATTISTE (MRN [id number]) as of 07/10/2017 13:21  Ref. Range 07/09/2017 11:40 07/09/2017 17:02 07/09/2017 21:16 07/10/2017 07:41 07/10/2017 11:29  Glucose-Capillary Latest Ref Range: 65 - 99 mg/dL 210 (H) 218 (H) 208 (H) 228 (H) 221 (H)    Review of Glycemic Control  Diabetes history: DM2 Outpatient Diabetes medications: None Current orders for Inpatient glycemic control: Novolog 0-9 units TID with meals, Novolog 0-5 units QHS  Inpatient Diabetes Program Recommendations:   Insulin - Basal: CBGs have ranged from 208-228 mg/dl over the past 24 hours. If glucose continues to be greater than 200 mg/dl, please consider ordering low dose basal insulin; recommend starting with Levemir 5 units Q24H. Correction (SSI): If patient will remain NPO, please change frequency of CBGs and Novolog to Q4H.  Thanks, Barnie Alderman, RN, MSN, CDE Diabetes Coordinator Inpatient Diabetes Program [phone] (Team Pager from 8am to 5pm)

## 2017-07-10 NOTE — Evaluation (Signed)
Physical Therapy Evaluation Patient Details Name: William Morse MRN: [id number] DOB: 01-Mar-1931 Today's Date: 07/10/2017   History of Present Illness  Pt is an86 y.o.malewith a PMHx of BPH, chronic kidney disease stage III, diabetes mellitus type 2, hypertension, hyperlipidemia, who was admitted to [facility] on1/23/2019for evaluation of altered mental status.  Assessment includes: acute hyponatremia, CAP, HTN, DM, HLD, BPH, and CKD.     Clinical Impression  Pt presents with deficits in strength, transfers, mobility, gait, balance, and activity tolerance.  Pt required +2 extensive assistance for all bed mobility and transfer tasks.  Pt able to follow most 1-step commands during functional mobility assessment but required extensive cueing to do so.  Pt able to amb very limited distance with shuffling steps backwards and L side-stepping toward [facility] with min A for stability and to maneuver RW.  Pt will benefit from PT services in a SNF setting upon discharge to safely address above deficits for decreased caregiver assistance and eventual return to PLOF.      Follow Up Recommendations SNF    Equipment Recommendations  Other (comment);Rolling walker with 5" wheels(TBD at next venue of care if d/c to SNF)    Recommendations for Other Services       Precautions / Restrictions Precautions Precautions: Fall Precaution Comments: Watch Na level Restrictions Weight Bearing Restrictions: No      Mobility  Bed Mobility Overal bed mobility: Needs Assistance Bed Mobility: Supine to Sit;Sit to Supine     Supine to sit: +2 for physical assistance;Max assist Sit to supine: Max assist   General bed mobility comments: Extensive cueing for sequencing and physical assistance required for bed mobility tasks  Transfers Overall transfer level: Needs assistance Equipment used: Rolling walker (2 wheeled) Transfers: Sit to/from Stand Sit to Stand: +2 physical assistance;Mod assist          General transfer comment: Posterior instability upon initial stand that improved with cues for positioning  Ambulation/Gait Ambulation/Gait assistance: Min assist;+2 safety/equipment Ambulation Distance (Feet): 1 Feet Assistive device: Rolling walker (2 wheeled) Gait Pattern/deviations: Step-to pattern;Shuffle   Gait velocity interpretation: <1.8 ft/sec, indicative of risk for recurrent falls General Gait Details: Minimal backwards and L side-stepping toward HOB with min A for stability and to move RW and extensive cues for sequencing.  Stairs            Wheelchair Mobility    Modified Rankin (Stroke Patients Only)       Balance Overall balance assessment: Needs assistance Sitting-balance support: Feet unsupported;Feet supported Sitting balance-Leahy Scale: Fair     Standing balance support: Bilateral upper extremity supported Standing balance-Leahy Scale: Poor Standing balance comment: Occasional min A for stability in standing and heavy reliance on BUE support on RW                             Pertinent Vitals/Pain Pain Assessment: No/denies pain    Home Living Family/patient expects to be discharged to:: Private residence(History from son at bedside) Living Arrangements: Children(Pt lives with son Arnett Duddy) Available Help at Discharge: Family;Available PRN/intermittently Type of Home: House Home Access: Stairs to enter Entrance Stairs-Rails: None Entrance Stairs-Number of Steps: 3 Home Layout: One level Home Equipment: None      Prior Function Level of Independence: Independent;Needs assistance   Gait / Transfers Assistance Needed: Per pt's son, PLOF includes good cognition, Ind amb without AD community distances, drives, no fall history, and Ind with ADLs  ADL's /  Homemaking Assistance Needed: Pt's son assists with medications only, no other assistance with ADLs required        Hand Dominance   Dominant Hand: Right     Extremity/Trunk Assessment   Upper Extremity Assessment Upper Extremity Assessment: Generalized weakness    Lower Extremity Assessment Lower Extremity Assessment: Generalized weakness       Communication   Communication: No difficulties  Cognition Arousal/Alertness: Lethargic Behavior During Therapy: WFL for tasks assessed/performed Overall Cognitive Status: Impaired/Different from baseline Area of Impairment: Orientation;Attention;Following commands;Safety/judgement;Awareness                       Following Commands: Follows one step commands inconsistently;Follows one step commands with increased time       General Comments: Pt able to follow most 1-step commands but required extensive verbal and tactile cues      General Comments      Exercises Other Exercises Other Exercises: Pt declined attempts at therex stating "I'm too tired".   Assessment/Plan    PT Assessment Patient needs continued PT services  PT Problem List Decreased strength;Decreased activity tolerance;Decreased balance;Decreased knowledge of use of DME;Decreased mobility;Decreased safety awareness       PT Treatment Interventions DME instruction;Gait training;Stair training;Functional mobility training;Neuromuscular re-education;Balance training;Therapeutic exercise;Therapeutic activities;Patient/family education    PT Goals (Current goals can be found in the Care Plan section)  Acute Rehab PT Goals Patient Stated Goal: To get my strength back PT Goal Formulation: With patient Time For Goal Achievement: 07/23/17 Potential to Achieve Goals: Good    Frequency Min 2X/week   Barriers to discharge Inaccessible home environment;Decreased caregiver support      Co-evaluation               AM-PAC PT "6 Clicks" Daily Activity  Outcome Measure Difficulty turning over in bed (including adjusting bedclothes, sheets and blankets)?: Unable Difficulty moving from lying on back to sitting  on the side of the bed? : Unable Difficulty sitting down on and standing up from a chair with arms (e.g., wheelchair, bedside commode, etc,.)?: Unable Help needed moving to and from a bed to chair (including a wheelchair)?: A Lot Help needed walking in hospital room?: Total Help needed climbing 3-5 steps with a railing? : Total 6 Click Score: 7    End of Session Equipment Utilized During Treatment: Gait belt;Oxygen Activity Tolerance: Patient limited by fatigue Patient left: in bed;with bed alarm set;with call bell/phone within reach;with family/visitor present;with SCD's reapplied Nurse Communication: Mobility status PT Visit Diagnosis: Difficulty in walking, not elsewhere classified (R26.2);Muscle weakness (generalized) (M62.81)    Time: 1694-5038 PT Time Calculation (min) (ACUTE ONLY): 21 min   Charges:   PT Evaluation $PT Eval Moderate Complexity: 1 Mod     PT G Codes:        DRoyetta Asal PT, DPT 07/10/17, 2:13 PM

## 2017-07-10 NOTE — Progress Notes (Signed)
2nd attempt to reach pt's son, Emiliano Welshans at [phone]. No answer.

## 2017-07-10 NOTE — Progress Notes (Signed)
[facility]  ROUNDING NOTE   Subjective:   Son at bedside.  Tolvaptan yesterday  Na 126 (129)  Abd distended. Liquid diarrhea  Confused  Objective:  Vital signs in last 24 hours:  Temp:  [98.1 F (36.7 C)-98.8 F (37.1 C)] 98.1 F (36.7 C) (01/28 0800) Pulse Rate:  [73-111] 85 (01/28 0800) Resp:  [13-30] 20 (01/28 0800) BP: (148-191)/(56-100) 177/69 (01/28 0800) SpO2:  [90 %-100 %] 96 % (01/28 0800)  Weight change:  Filed Weights   07/05/17 1744 07/06/17 0119  Weight: 81.6 kg (180 lb) 82 kg (180 lb 12.4 oz)    Intake/Output: I/O last 3 completed shifts: In: 1578.8 [P.O.:240; I.V.:738.8; Other:550; IV Piggyback:50] Out: 1435 [FYBOF:7510]   Intake/Output this shift:  No intake/output data recorded.  Physical Exam: General: No acute distress, sitting up in bed  Head: Normocephalic, atraumatic. Moist oral mucosal membranes  Eyes: Anicteric  Neck: Supple, trachea midline  Lungs:  Bilaterally wheezing  Heart: S1S2 no rubs  Abdomen:  +distended, nontender, hypoactive bowel sounds, tympanic on percussion  Extremities: Trace peripheral edema.  Neurologic: Confused, altert to self and place  Skin: No lesions       Basic Metabolic Panel: Recent Labs  Lab 07/05/17 1742  07/06/17 0445  07/08/17 0147  07/09/17 0459 07/09/17 0906 07/09/17 1357 07/09/17 1508 07/09/17 2246 07/10/17 0428  NA 107*   < > 112*   < > 119*   < > 126* 126* 127* 129*  127* 126* 126*  K 4.3  --  3.7  --  3.8  --  4.6  --   --  4.6  --   --   CL 74*  --  82*  --  90*  --  96*  --   --  99*  --   --   CO2 22  --  23  --  20*  --  23  --   --  22  --   --   GLUCOSE 204*  --  88  --  142*  --  184*  --   --  223*  --   --   BUN 37*  --  40*  --  44*  --  42*  --   --  38*  --   --   CREATININE 1.35*  --  1.47*  --  1.55*  --  1.61*  --   --  1.50*  --   --   CALCIUM 7.9*  --  7.6*  --  7.9*  --  8.5*  --   --  8.6*  --   --   MG  --   --   --   --  1.7  --  1.8  --   --   --    --   --   PHOS  --   --   --   --  3.8  --  3.5  --   --   --   --   --    < > = values in this interval not displayed.    Liver Function Tests: Recent Labs  Lab 07/05/17 1742  AST 141*  ALT 50  ALKPHOS 59  BILITOT 1.0  PROT 7.2  ALBUMIN 3.6   No results for input(s): LIPASE, AMYLASE in the last 168 hours. No results for input(s): AMMONIA in the last 168 hours.  CBC: Recent Labs  Lab 07/05/17 1742 07/06/17 0445 07/08/17 0147  WBC  7.7 7.7 7.6  HGB 12.6* 10.7* 10.2*  HCT 35.1* 29.7* 29.3*  MCV 89.8 89.7 93.4  PLT 290 245 223    Cardiac Enzymes: Recent Labs  Lab 07/05/17 1742 07/06/17 0047 07/06/17 0445 07/06/17 1247  TROPONINI 0.23* 0.23* 0.22* 0.22*    BNP: Invalid input(s): POCBNP  CBG: Recent Labs  Lab 07/09/17 0740 07/09/17 1140 07/09/17 1702 07/09/17 2116 07/10/17 0741  GLUCAP 180* 210* 218* 208* 228*    Microbiology: Results for orders placed or performed during the hospital encounter of 07/05/17  Blood culture (routine x 2)     Status: None   Collection Time: 07/05/17  9:45 PM  Result Value Ref Range Status   Specimen Description BLOOD LT HAND  Final   Special Requests   Final    BOTTLES DRAWN AEROBIC AND ANAEROBIC Blood Culture adequate volume   Culture   Final    NO GROWTH 5 DAYS Performed at [facility], [address] [place], [place] [address]    Report Status 07/10/2017 FINAL  Final  Blood culture (routine x 2)     Status: None   Collection Time: 07/05/17  9:45 PM  Result Value Ref Range Status   Specimen Description BLOOD RT [facility]  Final   Special Requests   Final    BOTTLES DRAWN AEROBIC AND ANAEROBIC Blood Culture adequate volume   Culture   Final    NO GROWTH 5 DAYS Performed at [facility], [address] [place], [place] [address]    Report Status 07/10/2017 FINAL  Final  MRSA PCR Screening     Status: None   Collection Time: 07/06/17  7:16 AM  Result Value Ref Range Status   MRSA by PCR NEGATIVE  NEGATIVE Final    Comment:        The GeneXpert MRSA Assay (FDA approved for NASAL specimens only), is one component of a comprehensive MRSA colonization surveillance program. It is not intended to diagnose MRSA infection nor to guide or monitor treatment for MRSA infections. Performed at [facility], [address] [place], [place] [address]     Coagulation Studies: No results for input(s): LABPROT, INR in the last 72 hours.  Urinalysis: No results for input(s): COLORURINE, LABSPEC, PHURINE, GLUCOSEU, HGBUR, BILIRUBINUR, KETONESUR, PROTEINUR, UROBILINOGEN, NITRITE, LEUKOCYTESUR in the last 72 hours.  Invalid input(s): APPERANCEUR    Imaging: No results found.   Medications:   . cefTRIAXone (ROCEPHIN) IVPB 1 gram/50 mL D5W Stopped (07/09/17 1749)   . amLODipine  10 mg Oral QPM  . aspirin EC  81 mg Oral Daily  . atorvastatin  40 mg Oral q1800  . budesonide (PULMICORT) nebulizer solution  0.5 mg Nebulization BID  . enoxaparin (LOVENOX) injection  40 mg Subcutaneous Q24H  . insulin aspart  0-5 Units Subcutaneous QHS  . insulin aspart  0-9 Units Subcutaneous TID WC  . ipratropium-albuterol  3 mL Nebulization Q6H  . metoprolol succinate  100 mg Oral Daily  . polyethylene glycol  17 g Oral Daily  . sertraline  50 mg Oral Daily  . tamsulosin  0.4 mg Oral Daily  . tolvaptan  15 mg Oral Q24H   acetaminophen **OR** acetaminophen, bisacodyl, hydrALAZINE, ondansetron **OR** ondansetron (ZOFRAN) IV, simethicone  Assessment/ Plan:  84 y.o. male  with a PMHx of BPH, chronic kidney disease stage III, diabetes mellitus type 2, hypertension, hyperlipidemia, who was admitted to [facility] on 07/05/2017 for evaluation of altered mental status.  1.  Hyponatremia: euvolemic on examination.  Na of 107 on admission. Improved with cessation of hydrochlorothiazide and started on IV fluids. Status post 3% saline. Given tolvaptan on 1/27, yesterday.  Confusion is concerning, if no  improvement, will check CT for myelinosis.  - discontinue tolvaptan for now.  - Continue serial sodium checks.   2.  Chronic Kidney Disease stage III with proteinuria: baseline creatinine of 1.76 GFR of 39. On lisinopril as outpatient. Secondary to diabetes and hypertension.  3.  Hypertension: with diastolic congestive heart failure. Echo 1/24. Elevated blood pressure this morning.  - amlodipine, metoprolol and tamsulosin.   4.  Diabetes mellitus type 2 with chronic kidney disease stage III: insulin dependent.    LOS: 5 William Morse 1/28/20199:04 AM

## 2017-07-10 NOTE — Progress Notes (Signed)
Pt requesting for William Morse his son to be called. He is requesting to see his son. Attempted to call William Morse at [phone]. No answer, will try again later in the morning. Explained to patient that there was no answer presently and I would try again a little later.

## 2017-07-10 NOTE — NC FL2 (Signed)
[place] LEVEL OF CARE SCREENING TOOL     IDENTIFICATION  Patient Name: William Morse Birthdate: November 19, 1930 Sex: male Admission Date (Current Location): 07/05/2017  [place] and [place] Number:  Engineering geologist and Address:  [facility], [address] [place], [place] [address]      Provider Number: [id number]  Attending Physician Name and Address:  Dustin Flock, MD  Relative Name and Phone Number:       Current Level of Care: Hospital Recommended Level of Care: [place] Prior Approval Number:    Date Approved/Denied:   PASRR Number:   [id number]  Discharge Plan: SNF    Current Diagnoses: Patient Active Problem List   Diagnosis Date Noted  . Acute hyponatremia 07/05/2017  . HTN (hypertension) 07/05/2017  . Diabetes ([place]) 07/05/2017  . HLD (hyperlipidemia) 07/05/2017  . BPH (benign prostatic hyperplasia) 07/05/2017  . CKD (chronic kidney disease), stage III ([place]) 07/05/2017  . CAP (community acquired pneumonia) 07/05/2017    Orientation RESPIRATION BLADDER Height & Weight     Self, Time, Situation, Place  O2(2L) Continent Weight: 180 lb 12.4 oz (82 kg) Height:  5\' 7"  (170.2 cm)  BEHAVIORAL SYMPTOMS/MOOD NEUROLOGICAL BOWEL NUTRITION STATUS      Continent Diet  See DC summary  AMBULATORY STATUS COMMUNICATION OF NEEDS Skin   Extensive Assist Verbally(is delayed with some answers) Normal                       Personal Care Assistance Level of Assistance  Bathing, Feeding, Dressing Bathing Assistance: Maximum assistance Feeding assistance: Limited assistance Dressing Assistance: Maximum assistance     Functional Limitations Info  Sight, Hearing, Speech Sight Info: Adequate Hearing Info: Adequate Speech Info: Adequate    SPECIAL CARE FACTORS FREQUENCY  PT (By licensed PT), OT (By licensed OT)     PT Frequency: 5x OT Frequency: 5x            Contractures Contractures Info: Not  present    Additional Factors Info  Code Status, Allergies Code Status Info: Full Code Allergies Info: NKA           Current Medications (07/10/2017):  This is the current hospital active medication list Current Facility-Administered Medications  Medication Dose Route Frequency Provider Last Rate Last Dose  . 0.9 %  sodium chloride infusion   Intravenous Continuous Wilhelmina Mcardle, MD      . acetaminophen (TYLENOL) tablet 650 mg  650 mg Oral Q6H PRN Lance Coon, MD       Or  . acetaminophen (TYLENOL) suppository 650 mg  650 mg Rectal Q6H PRN Lance Coon, MD      . aspirin EC tablet 81 mg  81 mg Oral Daily Lance Coon, MD   81 mg at 07/10/17 1023  . atorvastatin (LIPITOR) tablet 40 mg  40 mg Oral q1800 Lance Coon, MD   40 mg at 07/09/17 1712  . bisacodyl (DULCOLAX) EC tablet 5 mg  5 mg Oral Daily PRN Awilda Bill, NP   5 mg at 07/08/17 2141  . cefTRIAXone (ROCEPHIN) 1 g in dextrose 5 % 50 mL IVPB  1 g Intravenous Q24H Lance Coon, MD   Stopped at 07/09/17 1749  . enoxaparin (LOVENOX) injection 40 mg  40 mg Subcutaneous Q24H Lance Coon, MD   40 mg at 07/09/17 2023  . hydrALAZINE (APRESOLINE) injection 10-40 mg  10-40 mg Intravenous Q4H PRN Wilhelmina Mcardle, MD  20 mg at 07/10/17 1030  . insulin aspart (novoLOG) injection 0-5 Units  0-5 Units Subcutaneous QHS Lance Coon, MD   2 Units at 07/09/17 2126  . insulin aspart (novoLOG) injection 0-9 Units  0-9 Units Subcutaneous TID WC Lance Coon, MD   3 Units at 07/10/17 1143  . ipratropium-albuterol (DUONEB) 0.5-2.5 (3) MG/3ML nebulizer solution 3 mL  3 mL Nebulization Q4H PRN Wilhelmina Mcardle, MD      . metoprolol tartrate (LOPRESSOR) injection 2.5-5 mg  2.5-5 mg Intravenous Q3H PRN Wilhelmina Mcardle, MD      . ondansetron [facility]) tablet 4 mg  4 mg Oral Q6H PRN Lance Coon, MD       Or  . ondansetron [facility]) injection 4 mg  4 mg Intravenous Q6H PRN Lance Coon, MD   4 mg at 07/10/17 0340  . simethicone (MYLICON)  chewable tablet 80 mg  80 mg Oral QID PRN Mikael Spray, NP   80 mg at 07/10/17 0326  . tamsulosin (FLOMAX) capsule 0.4 mg  0.4 mg Oral Daily Lance Coon, MD   0.4 mg at 07/10/17 1023  . tolvaptan (SAMSCA) tablet 15 mg  15 mg Oral Q24H Lateef, Munsoor, MD   15 mg at 07/10/17 1432     Discharge Medications: Please see discharge summary for a list of discharge medications.  Relevant Imaging Results:  Relevant Lab Results:   Additional Information SSN: [id number]  William Morse, William Morse

## 2017-07-10 NOTE — Progress Notes (Signed)
Pt up to [facility] with 1 assist. Pt became lethargic and was unable to stand to return to bed.Pt also stating that he does not want to get back in bed and that he wants to sit up in a chair. Pt was a 3 person assist back to bed.  Bed placed into chair position, pt verbalized improvement but continued to ask for his son William Morse to be called. 2 attempts to call pt's son with no answer. Later in the morning, William Morse, called to check on pt and was notified that his father was requesting to see him. Son at bedside during shift change. Elevated BP this shift with PRN's given, some improvement noted. Continue to monitor.

## 2017-07-10 NOTE — Progress Notes (Signed)
[name], NP notified of patient's decreased urine output of 255 ml over the past 10 hours. Also the patients sodium level this morning is126. William Morse states that she will discuss sodium tablets and decreased urine output with nephrology this morning. No new orders at this time. Continue to monitor closely.

## 2017-07-10 NOTE — Progress Notes (Signed)
No overt distress Mildly confused but not agitated Denies pain  Vitals:   07/10/17 0800 07/10/17 0900 07/10/17 1357 07/10/17 1400  BP: (!) 177/69 (!) 177/64  (!) 157/50  Pulse: 85 89 90 89  Resp: 20 20  14   Temp: 98.1 F (36.7 C)     TempSrc: Oral     SpO2: 96% 95% 97% 100%  Weight:      Height:        NAD HEENT WNL JVP not visualized Chest clear anteriorly Regular, no M Abdomen distended, firm, diffusely tympanitic Extremities warm, no edema No focal neurologic deficits  BMP Latest Ref Rng & Units 07/10/2017 07/10/2017 07/10/2017  Glucose 65 - 99 mg/dL - - -  BUN 6 - 20 mg/dL - - -  Creatinine 0.61 - 1.24 mg/dL - - -  Sodium 135 - 145 mmol/L 126(L) 125(L) 126(L)  Potassium 3.5 - 5.1 mmol/L - - -  Chloride 101 - 111 mmol/L - - -  CO2 22 - 32 mmol/L - - -  Calcium 8.9 - 10.3 mg/dL - - -     BMET    Component Value Date/Time   NA 126 (L) 07/10/2017 1717   K 4.6 07/09/2017 1508   CL 99 (L) 07/09/2017 1508   CO2 22 07/09/2017 1508   GLUCOSE 223 (H) 07/09/2017 1508   BUN 38 (H) 07/09/2017 1508   CREATININE 1.50 (H) 07/09/2017 1508   CALCIUM 8.6 (L) 07/09/2017 1508   GFRNONAA 41 (L) 07/09/2017 1508   GFRAA 48 (L) 07/09/2017 1508   CBC Latest Ref Rng & Units 07/08/2017 07/06/2017 07/05/2017  WBC 3.8 - 10.6 K/uL 7.6 7.7 7.7  Hemoglobin 13.0 - 18.0 g/dL 10.2(L) 10.7(L) 12.6(L)  Hematocrit 40.0 - 52.0 % 29.3(L) 29.7(L) 35.1(L)  Platelets 150 - 440 K/uL 223 245 290    No new chest x-ray  KUB: Diffuse gaseous distention of colon and to lesser extent small intestine   IMPRESSION: Hyponatremia, presumed SIADH Chronic renal insufficiency Colonic ileus  PLAN/REC: Management of SIADH per renal service Normal saline infusion initiated N.p.o. Dulcolax suppository ordered If abdominal distention persists, consider gastroenterology evaluation 1/29  Merton Border, MD PCCM service Mobile [phone] Pager [phone] 07/10/2017 6:04 PM

## 2017-07-10 NOTE — Clinical Social Work Note (Signed)
Clinical Social Work Assessment  Patient Details  Name: William Morse MRN: [id number] Date of Birth: 18-Jan-1931  Date of referral:  07/10/17               Reason for consult:  Facility Placement, Discharge Planning                Permission sought to share information with:  Case Manager, Facility Sport and exercise psychologist, Family Supports Permission granted to share information::     Name::        Agency::     Relationship::  William Morse, son  Sport and exercise psychologist Information:     Housing/Transportation Living arrangements for the past 2 months:  Single Family Home Source of Information:  Medical Team, Adult Children Patient Interpreter Needed:  None Criminal Activity/Legal Involvement Pertinent to Current Situation/Hospitalization:  No - Comment as needed Significant Relationships:  Adult Children Lives with:  Adult Children Do you feel safe going back to the place where you live?  No Need for family participation in patient care:  Yes (Comment)  Care giving concerns:  Patient admitted to [facility] for AMS with confusion, weakness for the past 2 days.  Patient had an episode where he was largely unresponsive, and was brought by family to the ED.  Here on this writer's interview he is awake and alert.  He does state that he has been feeling weak over the past couple of days and then it has been getting progressively worse.  Evaluation here shows profound hyponatremia, and elevated troponin.  He has some opacities on his chest x-ray interpreted as pulmonary edema versus infiltrate.  Patient is coming from home with his son William Morse.  William Morse reports he works during the day and prior to hospitalization patient was indepdnent with ADLs and needed limited to no assist.  Patient currently presents with deficits in strength, transfers, mobility, gait, balance, and activity tolerance.  Pt required +2 extensive assistance for all bed mobility and transfer tasks.  Pt able to follow most 1-step commands during functional  mobility assessment but required extensive cueing to do so.  Pt able to amb very limited distance with shuffling steps backwards and L side-stepping toward [facility] with min A for stability and to maneuver RW. Due to new onset of deficts, recommendations are for ST SNF. Son is in agreement to get him stronger and return safely home.   Social Worker assessment / plan:  LCSW completed assessment and consult with son as patient was receiving nursing care. Son reports patient has never completed placement and this was new for the two.  LCSW provided SNF list and educated son on SNF benefit for insurance, authorization, ability to understand the list and bed offers/selection.  Son voices understanding and was provided time to ask questions. Son reports he lives near [place] and this might be a good fit due to location. LCSW explain process and follow up regarding bed offers once available. Son will speak with his other brother as well for recommendations and assistance.  Plan: Will complete SNF work up. Will make referral to HTA for authorization Will call son on 1/29 for update and bed offers/choice.  Employment status:  Retired Nurse, adult PT Recommendations:  [place] / Referral to community resources:  [place]  Patient/Family's Response to care:  Agreeable and appreciative of time and information.   Patient/Family's Understanding of and Emotional Response to Diagnosis, Current Treatment, and Prognosis:  Son reports he is agreeable and understands  reasons for SNF admissions and his father's condition needing more help than he is able to offer at this time.   Emotional Assessment Appearance:  Appears stated age Attitude/Demeanor/Rapport:    Affect (typically observed):  Accepting, Adaptable Orientation:  Oriented to Self, Oriented to Place Alcohol / Substance use:  Not Applicable Psych involvement (Current and /or in the  community):  No (Comment)  Discharge Needs  Concerns to be addressed:  Care Coordination, Discharge Planning Concerns Readmission within the last 30 days:  No Current discharge risk:  None Barriers to Discharge:  Continued Medical Work up, [place], [place], [place] 07/10/2017, 3:56 PM

## 2017-07-11 ENCOUNTER — Inpatient Hospital Stay: Payer: PPO

## 2017-07-11 DIAGNOSIS — N183 Chronic kidney disease, stage 3 (moderate): Secondary | ICD-10-CM

## 2017-07-11 LAB — CBC
HCT: 29.2 % — ABNORMAL LOW (ref 40.0–52.0)
HEMOGLOBIN: 9.9 g/dL — AB (ref 13.0–18.0)
MCH: 32.1 pg (ref 26.0–34.0)
MCHC: 33.7 g/dL (ref 32.0–36.0)
MCV: 95.1 fL (ref 80.0–100.0)
Platelets: 283 10*3/uL (ref 150–440)
RBC: 3.07 MIL/uL — AB (ref 4.40–5.90)
RDW: 12.6 % (ref 11.5–14.5)
WBC: 9.4 10*3/uL (ref 3.8–10.6)

## 2017-07-11 LAB — COMPREHENSIVE METABOLIC PANEL
ALK PHOS: 40 U/L (ref 38–126)
ALT: 31 U/L (ref 17–63)
AST: 29 U/L (ref 15–41)
Albumin: 2.5 g/dL — ABNORMAL LOW (ref 3.5–5.0)
Anion gap: 6 (ref 5–15)
BILIRUBIN TOTAL: 0.4 mg/dL (ref 0.3–1.2)
BUN: 45 mg/dL — ABNORMAL HIGH (ref 6–20)
CALCIUM: 8.5 mg/dL — AB (ref 8.9–10.3)
CO2: 23 mmol/L (ref 22–32)
CREATININE: 1.71 mg/dL — AB (ref 0.61–1.24)
Chloride: 99 mmol/L — ABNORMAL LOW (ref 101–111)
GFR, EST AFRICAN AMERICAN: 41 mL/min — AB (ref >60)
GFR, EST NON AFRICAN AMERICAN: 35 mL/min — AB (ref >60)
Glucose, Bld: 173 mg/dL — ABNORMAL HIGH (ref 65–99)
Potassium: 4.7 mmol/L (ref 3.5–5.1)
Sodium: 128 mmol/L — ABNORMAL LOW (ref 135–145)
TOTAL PROTEIN: 5.5 g/dL — AB (ref 6.5–8.1)

## 2017-07-11 LAB — GLUCOSE, CAPILLARY
GLUCOSE-CAPILLARY: 149 mg/dL — AB (ref 65–99)
GLUCOSE-CAPILLARY: 159 mg/dL — AB (ref 65–99)
Glucose-Capillary: 154 mg/dL — ABNORMAL HIGH (ref 65–99)
Glucose-Capillary: 221 mg/dL — ABNORMAL HIGH (ref 65–99)
Glucose-Capillary: 163 mg/dL — ABNORMAL HIGH (ref 65–99)

## 2017-07-11 MED ORDER — AMLODIPINE BESYLATE 10 MG PO TABS
10.0000 mg | ORAL_TABLET | Freq: Every day | ORAL | Status: DC
Start: 2017-07-11 — End: 2017-07-14
  Administered 2017-07-11 – 2017-07-13 (×3): 10 mg via ORAL
  Filled 2017-07-11 (×3): qty 1

## 2017-07-11 MED ORDER — METOPROLOL SUCCINATE ER 50 MG PO TB24
50.0000 mg | ORAL_TABLET | Freq: Every day | ORAL | Status: DC
  Administered 2017-07-11 – 2017-07-13 (×3): 50 mg via ORAL
  Filled 2017-07-11 (×2): qty 1

## 2017-07-11 NOTE — Progress Notes (Signed)
Much improved.  Has moved his bowels.  Abdominal distention resolved. No overt distress Well oriented and appropriate Denies pain  Vitals:   07/11/17 0749 07/11/17 0900 07/11/17 1100 07/11/17 1153  BP:  (!) 141/53 (!) 150/59   Pulse: (!) 109 96 91 99  Resp: (!) 21 15 17 19   Temp: 97.9 F (36.6 C)   98.1 F (36.7 C)  TempSrc: Oral   Oral  SpO2: 99% 96% 97% 97%  Weight:      Height:        NAD HEENT WNL JVP not visualized Chest clear anteriorly Regular, no M Abdomen much less distended, BS present, nontender Extremities warm, no edema No focal neurologic deficits  BMP Latest Ref Rng & Units 07/11/2017 07/10/2017 07/10/2017  Glucose 65 - 99 mg/dL 173(H) - -  BUN 6 - 20 mg/dL 45(H) - -  Creatinine 0.61 - 1.24 mg/dL 1.71(H) - -  Sodium 135 - 145 mmol/L 128(L) 126(L) 125(L)  Potassium 3.5 - 5.1 mmol/L 4.7 - -  Chloride 101 - 111 mmol/L 99(L) - -  CO2 22 - 32 mmol/L 23 - -  Calcium 8.9 - 10.3 mg/dL 8.5(L) - -     BMET    Component Value Date/Time   NA 128 (L) 07/11/2017 0111   K 4.7 07/11/2017 0111   CL 99 (L) 07/11/2017 0111   CO2 23 07/11/2017 0111   GLUCOSE 173 (H) 07/11/2017 0111   BUN 45 (H) 07/11/2017 0111   CREATININE 1.71 (H) 07/11/2017 0111   CALCIUM 8.5 (L) 07/11/2017 0111   GFRNONAA 35 (L) 07/11/2017 0111   GFRAA 41 (L) 07/11/2017 0111   CBC Latest Ref Rng & Units 07/11/2017 07/08/2017 07/06/2017  WBC 3.8 - 10.6 K/uL 9.4 7.6 7.7  Hemoglobin 13.0 - 18.0 g/dL 9.9(L) 10.2(L) 10.7(L)  Hematocrit 40.0 - 52.0 % 29.2(L) 29.3(L) 29.7(L)  Platelets 150 - 440 K/uL 283 223 245    CXR: Mild cardiomegaly, probable mild edema with small R >L effusions   IMPRESSION: Hyponatremia, presumed SIADH -mild, stable Chronic renal insufficiency Colonic ileus, resolved Doubt pneumonia   PLAN/REC: Continue to monitor chemistry panel daily Discontinue ceftriaxone Advance diet as able Transfer to MedSurg floor After transfer, PCCM will sign off. Please call if we can be of  further assistance  Merton Border, MD PCCM service Mobile [phone] Pager [phone] 07/11/2017 2:06 PM

## 2017-07-11 NOTE — Progress Notes (Signed)
[place] at [place] NAME: William Morse    MR#:  [id number]  DATE OF BIRTH:  [birth year]  SUBJECTIVE:  CHIEF COMPLAINT:   Chief Complaint  Patient presents with  . Altered Mental Status   Patient's abdominal distention is resolved shortness of breath improved  REVIEW OF SYSTEMS:  Review of Systems  Constitutional: Negative for chills, fever and weight loss.  HENT: Negative for nosebleeds and sore throat.   Eyes: Negative for blurred vision.  Respiratory: Negative for cough, shortness of breath and wheezing.   Cardiovascular: Negative for chest pain, orthopnea, leg swelling and PND.  Gastrointestinal: Negative for abdominal pain, constipation, diarrhea, heartburn, nausea and vomiting.  Genitourinary: Negative for dysuria and urgency.  Musculoskeletal: Negative for back pain.  Skin: Negative for rash.  Neurological: Negative for dizziness, speech change, focal weakness and headaches.  Endo/Heme/Allergies: Does not bruise/bleed easily.  Psychiatric/Behavioral: Negative for depression.    DRUG ALLERGIES:  No Known Allergies VITALS:  Blood pressure (!) 127/59, pulse 97, temperature 97.8 F (36.6 C), temperature source Oral, resp. rate 17, height 5\' 7"  (1.702 m), weight 180 lb 12.4 oz (82 kg), SpO2 99 %. PHYSICAL EXAMINATION:  Physical Exam  Constitutional: He is oriented to person, place, and time and well-developed, well-nourished, and in no distress.  HENT:  Head: Normocephalic and atraumatic.  Eyes: Conjunctivae and EOM are normal. Pupils are equal, round, and reactive to light.  Neck: Normal range of motion. Neck supple. No tracheal deviation present. No thyromegaly present.  Cardiovascular: Normal rate, regular rhythm and normal heart sounds.  Pulmonary/Chest: Effort normal and breath sounds normal. No respiratory distress. He has no wheezes. He exhibits no tenderness.  Abdominal: Soft. Bowel sounds are normal. There is no  tenderness.  Musculoskeletal: Normal range of motion.  Neurological: He is alert and oriented to person, place, and time. No cranial nerve deficit.  Skin: Skin is warm and dry. No rash noted.  Psychiatric: Mood and affect normal.   LABORATORY PANEL:  Male CBC Recent Labs  Lab 07/11/17 0111  WBC 9.4  HGB 9.9*  HCT 29.2*  PLT 283   ------------------------------------------------------------------------------------------------------------------ Chemistries  Recent Labs  Lab 07/09/17 0459  07/11/17 0111  NA 126*   < > 128*  K 4.6   < > 4.7  CL 96*   < > 99*  CO2 23   < > 23  GLUCOSE 184*   < > 173*  BUN 42*   < > 45*  CREATININE 1.61*   < > 1.71*  CALCIUM 8.5*   < > 8.5*  MG 1.8  --   --   AST  --   --  29  ALT  --   --  31  ALKPHOS  --   --  40  BILITOT  --   --  0.4   < > = values in this interval not displayed.   RADIOLOGY:  Dg Chest Port 1 View  Result Date: 07/11/2017 CLINICAL DATA:  84 year old male with SIADH. Shortness of breath, abdominal distension. EXAM: PORTABLE CHEST 1 VIEW COMPARISON:  07/05/2017 FINDINGS: Upright AP view at 0527 hours. Stable cardiac size and mediastinal contours. Increased bilateral retrocardiac opacity. Suggestion of small right pleural effusion tracking into the minor fissure. No pneumothorax. Normalized pulmonary vascularity since 07/05/2017. IMPRESSION: 1. Progressed bilateral lower lobe collapse or consolidation since 07/05/2017. 2. Small right pleural effusion suspected. Possible small left pleural effusion. 3. Normalized pulmonary vascularity, no edema. Electronically Signed  By: Genevie Ann M.D.   On: 07/11/2017 07:26   ASSESSMENT AND PLAN:  84 y.o. male with a PMHx of BPH, chronic kidney disease stage III, diabetes mellitus type 2, hypertension, hyperlipidemia, who was admitted to [facility] on 07/05/2017 for evaluation of altered mental status  * Acute hyponatremia - likely secondary to HCTZ  -Sodium continuing to improve -Follow  closely -Appreciate nephrology input -Patient now on normal saline  *Abdominal distention likely due to partial small bowel obstruction now resolved  *  CAP (community acquired pneumonia) -status post treatment with antibiotics  * HTN (hypertension) -continue Norvasc, Metoprolol  * Diabetes ([place]) -continue sliding scale insulin with corresponding glucose checks  * HLD (hyperlipidemia) - continue lipitor  * BPH (benign prostatic hyperplasia) -continue Flomax  * CKD (chronic kidney disease), stage III ([place]) -at baseline, monitor and avoid nephrotoxins        All the records are reviewed and case discussed with Care Management/Social Worker. Management plans discussed with the patient, family and they are in agreement.  CODE STATUS: Full Code  TOTAL TIME TAKING CARE OF THIS PATIENT: 15 minutes.   More than 50% of the time was spent in counseling/coordination of care: YES  POSSIBLE D/C IN 2-3 DAYS, DEPENDING ON CLINICAL CONDITION.   Dustin Flock M.D on 07/11/2017 at 5:58 PM  Between 7am to 6pm - Pager - [phone]  After 6pm go to [email or web address] - Proofreader  [facility]  Office  [phone]  CC: Primary care physician; Leonel Ramsay, MD  Note: This dictation was prepared with Dragon dictation along with smaller phrase technology. Any transcriptional errors that result from this process are unintentional.

## 2017-07-11 NOTE — Progress Notes (Signed)
Called report to [employer]. Patient will be transferred via bed to new room 158. Patient is stable, resting in bed.

## 2017-07-11 NOTE — Progress Notes (Signed)
   07/11/17 1135  Clinical Encounter Type  Visited With Patient not available  Visit Type Initial  Referral From Chaplain   Chaplain attempted visit; patient asleep.

## 2017-07-11 NOTE — Progress Notes (Signed)
LCSW following for disposition and New SNF placement  Call placed to son to follow up with bed offers and list. Son Cletus Gash has not yet spoken to his brother, but he has been given the bed offers and will call this writer back on Wednesday regarding his choice.  He is given the opportunity to ask any questions or discuss needs. At this time he declines needs or questions.  LCSW will follow up with son on 1/30.   Plan: Anticipate DC to SNF Still need bed choice and insurance authorization.  Lane Hacker, MSW Clinical Social Work: [employer] Coverage for :  [phone]

## 2017-07-11 NOTE — Progress Notes (Signed)
[facility]  ROUNDING NOTE   Subjective:   Belly much softer. BM overnight.   Na 128  Discontinued sertraline yesterday - new medication started in 12/18 by PCP  Started on NS overnight  Objective:  Vital signs in last 24 hours:  Temp:  [97.9 F (36.6 C)-98.7 F (37.1 C)] 97.9 F (36.6 C) (01/29 0749) Pulse Rate:  [80-109] 109 (01/29 0749) Resp:  [10-24] 21 (01/29 0749) BP: (99-183)/(47-66) 171/60 (01/29 0700) SpO2:  [95 %-100 %] 99 % (01/29 0749)  Weight change:  Filed Weights   07/05/17 1744 07/06/17 0119  Weight: 81.6 kg (180 lb) 82 kg (180 lb 12.4 oz)    Intake/Output: I/O last 3 completed shifts: In: 704.2 [I.V.:654.2; IV Piggyback:50] Out: 1935 [TIWPY:0998]   Intake/Output this shift:  Total I/O In: -  Out: 175 [Urine:175]  Physical Exam: General: No acute distress, sitting up in bed  Head: Normocephalic, atraumatic. Moist oral mucosal membranes  Eyes: Anicteric  Neck: Supple, trachea midline  Lungs:  Bilaterally wheezing  Heart: S1S2 no rubs  Abdomen:  +soft, obese  Extremities: Trace peripheral edema.  Neurologic: Confused, altert to self and place  Skin: No lesions       Basic Metabolic Panel: Recent Labs  Lab 07/06/17 0445  07/08/17 0147  07/09/17 0459  07/09/17 1508 07/09/17 2246 07/10/17 0428 07/10/17 0920 07/10/17 1717 07/11/17 0111  NA 112*   < > 119*   < > 126*   < > 129*  127* 126* 126* 125* 126* 128*  K 3.7  --  3.8  --  4.6  --  4.6  --   --   --   --  4.7  CL 82*  --  90*  --  96*  --  99*  --   --   --   --  99*  CO2 23  --  20*  --  23  --  22  --   --   --   --  23  GLUCOSE 88  --  142*  --  184*  --  223*  --   --   --   --  173*  BUN 40*  --  44*  --  42*  --  38*  --   --   --   --  45*  CREATININE 1.47*  --  1.55*  --  1.61*  --  1.50*  --   --   --   --  1.71*  CALCIUM 7.6*  --  7.9*  --  8.5*  --  8.6*  --   --   --   --  8.5*  MG  --   --  1.7  --  1.8  --   --   --   --   --   --   --   PHOS  --    --  3.8  --  3.5  --   --   --   --   --   --   --    < > = values in this interval not displayed.    Liver Function Tests: Recent Labs  Lab 07/05/17 1742 07/11/17 0111  AST 141* 29  ALT 50 31  ALKPHOS 59 40  BILITOT 1.0 0.4  PROT 7.2 5.5*  ALBUMIN 3.6 2.5*   No results for input(s): LIPASE, AMYLASE in the last 168 hours. No results for input(s): AMMONIA in the last 168 hours.  CBC: Recent Labs  Lab 07/05/17 1742 07/06/17 0445 07/08/17 0147 07/11/17 0111  WBC 7.7 7.7 7.6 9.4  HGB 12.6* 10.7* 10.2* 9.9*  HCT 35.1* 29.7* 29.3* 29.2*  MCV 89.8 89.7 93.4 95.1  PLT 290 245 223 283    Cardiac Enzymes: Recent Labs  Lab 07/05/17 1742 07/06/17 0047 07/06/17 0445 07/06/17 1247  TROPONINI 0.23* 0.23* 0.22* 0.22*    BNP: Invalid input(s): POCBNP  CBG: Recent Labs  Lab 07/10/17 0741 07/10/17 1129 07/10/17 1600 07/10/17 2122 07/11/17 0747  GLUCAP 228* 221* 212* 149* 159*    Microbiology: Results for orders placed or performed during the hospital encounter of 07/05/17  Blood culture (routine x 2)     Status: None   Collection Time: 07/05/17  9:45 PM  Result Value Ref Range Status   Specimen Description BLOOD LT HAND  Final   Special Requests   Final    BOTTLES DRAWN AEROBIC AND ANAEROBIC Blood Culture adequate volume   Culture   Final    NO GROWTH 5 DAYS Performed at [facility], [address] [place], [place] [address]    Report Status 07/10/2017 FINAL  Final  Blood culture (routine x 2)     Status: None   Collection Time: 07/05/17  9:45 PM  Result Value Ref Range Status   Specimen Description BLOOD RT [facility]  Final   Special Requests   Final    BOTTLES DRAWN AEROBIC AND ANAEROBIC Blood Culture adequate volume   Culture   Final    NO GROWTH 5 DAYS Performed at [facility], [address] [place], [place] [address]    Report Status 07/10/2017 FINAL  Final  MRSA PCR Screening     Status: None   Collection Time: 07/06/17  7:16 AM   Result Value Ref Range Status   MRSA by PCR NEGATIVE NEGATIVE Final    Comment:        The GeneXpert MRSA Assay (FDA approved for NASAL specimens only), is one component of a comprehensive MRSA colonization surveillance program. It is not intended to diagnose MRSA infection nor to guide or monitor treatment for MRSA infections. Performed at [facility], [address] [place], [place] [address]     Coagulation Studies: No results for input(s): LABPROT, INR in the last 72 hours.  Urinalysis: No results for input(s): COLORURINE, LABSPEC, PHURINE, GLUCOSEU, HGBUR, BILIRUBINUR, KETONESUR, PROTEINUR, UROBILINOGEN, NITRITE, LEUKOCYTESUR in the last 72 hours.  Invalid input(s): APPERANCEUR    Imaging: Dg Abd 1 View  Result Date: 07/10/2017 CLINICAL DATA:  Abdominal distention and ileus. EXAM: ABDOMEN - 1 VIEW COMPARISON:  07/08/2017 FINDINGS: Similar pattern of mildly distended and gas-filled colon suggestive of mild colonic ileus. Some gaseous distention of the stomach also present. No gross signs of free air. No significant small bowel dilatation. IMPRESSION: Similar mildly distended gas-filled colon is likely consistent with mild colonic ileus. Electronically Signed   By: Aletta Edouard M.D.   On: 07/10/2017 09:20   Dg Chest Port 1 View  Result Date: 07/11/2017 CLINICAL DATA:  84 year old male with SIADH. Shortness of breath, abdominal distension. EXAM: PORTABLE CHEST 1 VIEW COMPARISON:  07/05/2017 FINDINGS: Upright AP view at 0527 hours. Stable cardiac size and mediastinal contours. Increased bilateral retrocardiac opacity. Suggestion of small right pleural effusion tracking into the minor fissure. No pneumothorax. Normalized pulmonary vascularity since 07/05/2017. IMPRESSION: 1. Progressed bilateral lower lobe collapse or consolidation since 07/05/2017. 2. Small right pleural effusion suspected. Possible small left pleural effusion. 3.  Normalized pulmonary vascularity, no  edema. Electronically Signed   By: Genevie Ann M.D.   On: 07/11/2017 07:26     Medications:   . sodium chloride 50 mL/hr at 07/10/17 1945  . cefTRIAXone (ROCEPHIN) IVPB 1 gram/50 mL D5W Stopped (07/10/17 1741)   . aspirin EC  81 mg Oral Daily  . atorvastatin  40 mg Oral q1800  . enoxaparin (LOVENOX) injection  40 mg Subcutaneous Q24H  . insulin aspart  0-5 Units Subcutaneous QHS  . insulin aspart  0-9 Units Subcutaneous TID WC  . tamsulosin  0.4 mg Oral Daily   acetaminophen **OR** acetaminophen, bisacodyl, hydrALAZINE, ipratropium-albuterol, metoprolol tartrate, ondansetron **OR** ondansetron (ZOFRAN) IV, simethicone  Assessment/ Plan:  84 y.o. male  with a PMHx of BPH, chronic kidney disease stage III, diabetes mellitus type 2, hypertension, hyperlipidemia, who was admitted to [facility] on 07/05/2017 for evaluation of altered mental status.  1.  Hyponatremia: euvolemic on examination. Na of 107 on admission. Improved with cessation of hydrochlorothiazide and started on IV fluids. Status post 3% saline. Given tolvaptan on 1/27 and 1/28 Confusion is concerning, if no improvement, will check CT for myelinosis.  - discontinue tolvaptan    - Discontinued sertraline - Continue serial sodium checks.  - Continue NS IVF for now.   2.  Chronic Kidney Disease stage III with proteinuria: baseline creatinine of 1.76 GFR of 39. On lisinopril as outpatient. Secondary to diabetes and hypertension.  3.  Hypertension: with diastolic congestive heart failure. Echo 1/24. Elevated blood pressure this morning.  - amlodipine, metoprolol and tamsulosin.   4.  Diabetes mellitus type 2 with chronic kidney disease stage III: insulin dependent.    LOS: 6 Lawren Sexson 1/29/20198:43 AM

## 2017-07-12 ENCOUNTER — Inpatient Hospital Stay: Payer: PPO

## 2017-07-12 LAB — BASIC METABOLIC PANEL
ANION GAP: 8 (ref 5–15)
BUN: 44 mg/dL — ABNORMAL HIGH (ref 6–20)
CALCIUM: 8.5 mg/dL — AB (ref 8.9–10.3)
CO2: 19 mmol/L — AB (ref 22–32)
Chloride: 101 mmol/L (ref 101–111)
Creatinine, Ser: 1.4 mg/dL — ABNORMAL HIGH (ref 0.61–1.24)
GFR calc non Af Amer: 45 mL/min — ABNORMAL LOW (ref >60)
GFR, EST AFRICAN AMERICAN: 52 mL/min — AB (ref >60)
GLUCOSE: 177 mg/dL — AB (ref 65–99)
Potassium: 4.7 mmol/L (ref 3.5–5.1)
Sodium: 128 mmol/L — ABNORMAL LOW (ref 135–145)

## 2017-07-12 LAB — GLUCOSE, CAPILLARY
GLUCOSE-CAPILLARY: 208 mg/dL — AB (ref 65–99)
GLUCOSE-CAPILLARY: 206 mg/dL — AB (ref 65–99)
Glucose-Capillary: 164 mg/dL — ABNORMAL HIGH (ref 65–99)
Glucose-Capillary: 236 mg/dL — ABNORMAL HIGH (ref 65–99)

## 2017-07-12 NOTE — Progress Notes (Signed)
Clinical Social Worker (CSW) contacted patient's son Cletus Gash to get his SNF choice. Per son he is going to talk to his brother and get back to [place] today. CSW emphasized that sons will have to chose a facility in order for insurance authorization to be started. Per son he will call CSW back today. Health Team case manager aware of above.    [employer], LCSW [phone]

## 2017-07-12 NOTE — Progress Notes (Signed)
[facility] NAME: Bern Fare    MR#:  [id number]  DATE OF BIRTH:  03/31/1931  SUBJECTIVE:  CHIEF COMPLAINT:   Chief Complaint  Patient presents with  . Altered Mental Status   Patient's breathing is improved denies abdominal pain having bowel movement  REVIEW OF SYSTEMS:  Review of Systems  Constitutional: Negative for chills, fever and weight loss.  HENT: Negative for nosebleeds and sore throat.   Eyes: Negative for blurred vision.  Respiratory: Negative for cough, shortness of breath and wheezing.   Cardiovascular: Negative for chest pain, orthopnea, leg swelling and PND.  Gastrointestinal: Negative for abdominal pain, constipation, diarrhea, heartburn, nausea and vomiting.  Genitourinary: Negative for dysuria and urgency.  Musculoskeletal: Negative for back pain.  Skin: Negative for rash.  Neurological: Negative for dizziness, speech change, focal weakness and headaches.  Endo/Heme/Allergies: Does not bruise/bleed easily.  Psychiatric/Behavioral: Negative for depression.    DRUG ALLERGIES:  No Known Allergies VITALS:  Blood pressure (!) 171/63, pulse 85, temperature 98.8 F (37.1 C), temperature source Oral, resp. rate 18, height 5\' 7"  (1.702 m), weight 180 lb 12.4 oz (82 kg), SpO2 94 %. PHYSICAL EXAMINATION:  Physical Exam  Constitutional: He is oriented to person, place, and time and well-developed, well-nourished, and in no distress.  HENT:  Head: Normocephalic and atraumatic.  Eyes: Conjunctivae and EOM are normal. Pupils are equal, round, and reactive to light.  Neck: Normal range of motion. Neck supple. No tracheal deviation present. No thyromegaly present.  Cardiovascular: Normal rate, regular rhythm and normal heart sounds.  Pulmonary/Chest: Effort normal and breath sounds normal. No respiratory distress. He has no wheezes. He exhibits no tenderness.  Abdominal: Soft. Bowel sounds are normal. There is no  tenderness.  Musculoskeletal: Normal range of motion.  Neurological: He is alert and oriented to person, place, and time. No cranial nerve deficit.  Skin: Skin is warm and dry. No rash noted.  Psychiatric: Mood and affect normal.   LABORATORY PANEL:  Male CBC Recent Labs  Lab 07/11/17 0111  WBC 9.4  HGB 9.9*  HCT 29.2*  PLT 283   ------------------------------------------------------------------------------------------------------------------ Chemistries  Recent Labs  Lab 07/09/17 0459  07/11/17 0111 07/12/17 0504  NA 126*   < > 128* 128*  K 4.6   < > 4.7 4.7  CL 96*   < > 99* 101  CO2 23   < > 23 19*  GLUCOSE 184*   < > 173* 177*  BUN 42*   < > 45* 44*  CREATININE 1.61*   < > 1.71* 1.40*  CALCIUM 8.5*   < > 8.5* 8.5*  MG 1.8  --   --   --   AST  --   --  29  --   ALT  --   --  31  --   ALKPHOS  --   --  40  --   BILITOT  --   --  0.4  --    < > = values in this interval not displayed.   RADIOLOGY:  Dg Chest Port 1 View  Result Date: 07/12/2017 CLINICAL DATA:  Shortness of breath EXAM: PORTABLE CHEST 1 VIEW COMPARISON:  07/11/2017 FINDINGS: Cardiopericardial enlargement and vascular pedicle widening that is stable. Low volume chest with haziness attributed to pleural fluid and parenchymal opacity. No Kerley lines. No pneumothorax or air bronchogram. IMPRESSION: 1. Low volume chest with small pleural effusions and lower lobe opacification. Atelectasis or  infection are both possible. 2. Cardiomegaly. Electronically Signed   By: Monte Fantasia M.D.   On: 07/12/2017 10:26   ASSESSMENT AND PLAN:  84 y.o. male with a PMHx of BPH, chronic kidney disease stage III, diabetes mellitus type 2, hypertension, hyperlipidemia, who was admitted to [facility] on 07/05/2017 for evaluation of altered mental status  * Acute hyponatremia - likely secondary to HCTZ  -Sodium stable -Follow closely -Appreciate nephrology input -Recheck BMP in the a.m.   *Abdominal distention likely due to  partial small bowel obstruction now resolved  *  CAP (community acquired pneumonia) -status post treatment with antibiotics Repeat chest x-ray shows no pneumonia  * HTN (hypertension) -continue Norvasc, Metoprolol  * Diabetes ([place]) -continue sliding scale insulin with corresponding glucose checks  * HLD (hyperlipidemia) - continue lipitor  * BPH (benign prostatic hyperplasia) -continue Flomax  * CKD (chronic kidney disease), stage III ([place]) -at baseline, monitor and avoid nephrotoxins  Discharge  disposition will need skilled nursing facility placement      All the records are reviewed and case discussed with Care Management/Social Worker. Management plans discussed with the patient, family and they are in agreement.  CODE STATUS: Full Code  TOTAL TIME TAKING CARE OF THIS PATIENT: 15 minutes.   More than 50% of the time was spent in counseling/coordination of care: YES  POSSIBLE D/C IN 2-3 DAYS, DEPENDING ON CLINICAL CONDITION.   Dustin Flock M.D on 07/12/2017 at 1:45 PM  Between 7am to 6pm - Pager - [phone]  After 6pm go to [email or web address] - Proofreader  [facility]  Office  [phone]  CC: Primary care physician; Leonel Ramsay, MD  Note: This dictation was prepared with Dragon dictation along with smaller phrase technology. Any transcriptional errors that result from this process are unintentional.

## 2017-07-12 NOTE — Progress Notes (Signed)
Clinical Education officer, museum (CSW) met with patient and his nephew was at bedside. CSW presented bed offers to patient and he chose [facility]. Patient's son William Morse is in agreement with [facility]. Health Team case manager is aware of above. [facility] liaison is aware of above.   [employer], LCSW [phone]

## 2017-07-12 NOTE — Clinical Social Work Placement (Signed)
   CLINICAL SOCIAL WORK PLACEMENT  NOTE  Date:  07/12/2017  Patient Details  Name: William Morse MRN: [id number] Date of Birth: [birth year]  Clinical Social Work is seeking post-discharge placement for this patient at the [place] level of care (*CSW will initial, date and re-position this form in  chart as items are completed):  Yes   Patient/family provided with [place] Work Department's list of facilities offering this level of care within the geographic area requested by the patient (or if unable, by the patient's family).  Yes   Patient/family informed of their freedom to choose among providers that offer the needed level of care, that participate in Medicare, Medicaid or managed care program needed by the patient, have an available bed and are willing to accept the patient.  Yes   Patient/family informed of [facility]'s ownership interest in [facility] and [facility], as well as of the fact that they are under no obligation to receive care at these facilities.  PASRR submitted to EDS on 07/10/17     PASRR number received on 07/10/17     Existing PASRR number confirmed on       FL2 transmitted to all facilities in geographic area requested by pt/family on 07/10/17     FL2 transmitted to all facilities within larger geographic area on       Patient informed that his/her managed care company has contracts with or will negotiate with certain facilities, including the following:        Yes   Patient/family informed of bed offers received.  Patient chooses bed at       Physician recommends and patient chooses bed at      Patient to be transferred to   on  .  Patient to be transferred to facility by       Patient family notified on   of transfer.  Name of family member notified:        PHYSICIAN Please sign FL2     Additional Comment:    _______________________________________________ Falon Flinchum, Veronia Beets, LCSW 07/12/2017,  9:41 AM

## 2017-07-12 NOTE — Progress Notes (Signed)
Physical Therapy Treatment Patient Details Name: William Morse MRN: [id number] DOB: 1931-02-03 Today's Date: 07/12/2017    History of Present Illness Pt is an86 y.o.malewith a PMHx of BPH, chronic kidney disease stage III, diabetes mellitus type 2, hypertension, hyperlipidemia, who was admitted to [facility] on1/23/2019for evaluation of altered mental status.  Assessment includes: acute hyponatremia, CAP, HTN, DM, HLD, BPH, and CKD.     PT Comments    Pt continues to present with deficits in strength, transfers, mobility, gait, balance, and activity tolerance but overall made very good progress this session compared to prior session.  Pt required SBA with extra time and effort during bed mobility training.  Pt required min A during transfers but once in standing was able to remain upright with only CGA.  Pt fatigued quickly with limited amb but was steady without LOB.  Pt found in room on room air during a weaning trial with SpO2 measured at 88% that dropped to 86% with low intesity ankle pumps and quad sets.  Nursing in room and resumed pt on 2LO2/min with SpO2 quickly increasing to 94%.  SpO2 dropped to 90% after limited ambulation and then returned to mid-90s upon sitting.  HR remained in the low 80s to upper 90s throughout session.  Pt will benefit from PT services in a SNF setting upon discharge to safely address above deficits for decreased caregiver assistance and eventual return to PLOF.     Follow Up Recommendations  SNF     Equipment Recommendations  Other (comment)(TBD at next venue of care)    Recommendations for Other Services       Precautions / Restrictions Precautions Precautions: Fall Precaution Comments: Watch Na level Restrictions Weight Bearing Restrictions: No    Mobility  Bed Mobility Overal bed mobility: Needs Assistance Bed Mobility: Supine to Sit;Sit to Supine     Supine to sit: Supervision Sit to supine: Supervision   General bed mobility comments:  Good progress with bed mobility tasks this date with some extra time and effort required   Transfers Overall transfer level: Needs assistance Equipment used: Rolling walker (2 wheeled) Transfers: Sit to/from Stand Sit to Stand: Min assist         General transfer comment: Mod verbal cues required for sequencing during transfers  Ambulation/Gait Ambulation/Gait assistance: Min guard Ambulation Distance (Feet): 8 Feet Assistive device: Rolling walker (2 wheeled) Gait Pattern/deviations: Step-through pattern;Decreased step length - right;Decreased step length - left   Gait velocity interpretation: <1.8 ft/sec, indicative of risk for recurrent falls General Gait Details: Slow, effortful cadence with short B steps but significantly improved from prior session.      Stairs            Wheelchair Mobility    Modified Rankin (Stroke Patients Only)       Balance Overall balance assessment: Needs assistance Sitting-balance support: Feet unsupported;Feet supported;Bilateral upper extremity supported Sitting balance-Leahy Scale: Good     Standing balance support: Bilateral upper extremity supported Standing balance-Leahy Scale: Fair Standing balance comment: Heavy use of BUEs on RW in standing but significantly improved stability compared to prior session                            Cognition Arousal/Alertness: Awake/alert Behavior During Therapy: WFL for tasks assessed/performed Overall Cognitive Status: No family/caregiver present to determine baseline cognitive functioning  General Comments: Improved level of alertness and ability to follow commands this session       Exercises Total Joint Exercises Ankle Circles/Pumps: AROM;Both;5 reps;10 reps Quad Sets: Strengthening;Both;5 reps;10 reps Gluteal Sets: Strengthening;Both;5 reps;10 reps Hip ABduction/ADduction: AAROM;AROM;Both;10 reps Straight Leg Raises:  AROM;AAROM;Both;10 reps Long Arc Quad: AROM;Both;10 reps Knee Flexion: AROM;Both;10 reps Marching in Standing: AROM;Both;10 reps    General Comments        Pertinent Vitals/Pain Pain Assessment: No/denies pain    Home Living                      Prior Function            PT Goals (current goals can now be found in the care plan section) Progress towards PT goals: Progressing toward goals    Frequency    Min 2X/week      PT Plan      Co-evaluation              AM-PAC PT "6 Clicks" Daily Activity  Outcome Measure                   End of Session Equipment Utilized During Treatment: Gait belt;Oxygen Activity Tolerance: Patient limited by fatigue Patient left: in chair;with chair alarm set;with call bell/phone within reach Nurse Communication: Mobility status PT Visit Diagnosis: Difficulty in walking, not elsewhere classified (R26.2);Muscle weakness (generalized) (M62.81)     Time: 0940-1006 PT Time Calculation (min) (ACUTE ONLY): 26 min  Charges:  $Therapeutic Exercise: 8-22 mins $Therapeutic Activity: 8-22 mins                    G Codes:       DRoyetta Asal PT, DPT 07/12/17, 1:34 PM

## 2017-07-12 NOTE — Progress Notes (Signed)
Health Team SNF authorization has been received, authorization # [id number]. Patient can D/C to [facility] when medically stable. [facility] liaison is aware of above. Clinical Social Worker (CSW) contacted patient's son Cletus Gash and made him aware of above.   [employer], LCSW [phone]

## 2017-07-13 DIAGNOSIS — E871 Hypo-osmolality and hyponatremia: Secondary | ICD-10-CM | POA: Diagnosis not present

## 2017-07-13 DIAGNOSIS — F3289 Other specified depressive episodes: Secondary | ICD-10-CM | POA: Diagnosis not present

## 2017-07-13 DIAGNOSIS — K567 Ileus, unspecified: Secondary | ICD-10-CM | POA: Diagnosis not present

## 2017-07-13 DIAGNOSIS — D291 Benign neoplasm of prostate: Secondary | ICD-10-CM | POA: Diagnosis not present

## 2017-07-13 DIAGNOSIS — F419 Anxiety disorder, unspecified: Secondary | ICD-10-CM | POA: Diagnosis not present

## 2017-07-13 DIAGNOSIS — N183 Chronic kidney disease, stage 3 (moderate): Secondary | ICD-10-CM | POA: Diagnosis not present

## 2017-07-13 DIAGNOSIS — M6281 Muscle weakness (generalized): Secondary | ICD-10-CM | POA: Diagnosis not present

## 2017-07-13 DIAGNOSIS — N179 Acute kidney failure, unspecified: Secondary | ICD-10-CM | POA: Diagnosis not present

## 2017-07-13 DIAGNOSIS — R2681 Unsteadiness on feet: Secondary | ICD-10-CM | POA: Diagnosis not present

## 2017-07-13 DIAGNOSIS — E7849 Other hyperlipidemia: Secondary | ICD-10-CM | POA: Diagnosis not present

## 2017-07-13 DIAGNOSIS — I10 Essential (primary) hypertension: Secondary | ICD-10-CM | POA: Diagnosis not present

## 2017-07-13 DIAGNOSIS — J189 Pneumonia, unspecified organism: Secondary | ICD-10-CM | POA: Diagnosis not present

## 2017-07-13 DIAGNOSIS — E785 Hyperlipidemia, unspecified: Secondary | ICD-10-CM | POA: Diagnosis not present

## 2017-07-13 DIAGNOSIS — F329 Major depressive disorder, single episode, unspecified: Secondary | ICD-10-CM | POA: Diagnosis not present

## 2017-07-13 DIAGNOSIS — R4182 Altered mental status, unspecified: Secondary | ICD-10-CM | POA: Diagnosis not present

## 2017-07-13 DIAGNOSIS — Z7401 Bed confinement status: Secondary | ICD-10-CM | POA: Diagnosis not present

## 2017-07-13 DIAGNOSIS — E119 Type 2 diabetes mellitus without complications: Secondary | ICD-10-CM | POA: Diagnosis not present

## 2017-07-13 LAB — GLUCOSE, CAPILLARY
GLUCOSE-CAPILLARY: 129 mg/dL — AB (ref 65–99)
GLUCOSE-CAPILLARY: 235 mg/dL — AB (ref 65–99)
GLUCOSE-CAPILLARY: 225 mg/dL — AB (ref 65–99)

## 2017-07-13 LAB — BASIC METABOLIC PANEL
ANION GAP: 6 (ref 5–15)
BUN: 47 mg/dL — AB (ref 6–20)
CO2: 22 mmol/L (ref 22–32)
Calcium: 8.1 mg/dL — ABNORMAL LOW (ref 8.9–10.3)
Chloride: 103 mmol/L (ref 101–111)
Creatinine, Ser: 1.54 mg/dL — ABNORMAL HIGH (ref 0.61–1.24)
GFR, EST AFRICAN AMERICAN: 46 mL/min — AB (ref >60)
GFR, EST NON AFRICAN AMERICAN: 40 mL/min — AB (ref >60)
Glucose, Bld: 114 mg/dL — ABNORMAL HIGH (ref 65–99)
POTASSIUM: 4.6 mmol/L (ref 3.5–5.1)
SODIUM: 131 mmol/L — AB (ref 135–145)

## 2017-07-13 NOTE — NC FL2 (Signed)
[place] LEVEL OF CARE SCREENING TOOL     IDENTIFICATION  Patient Name: William Morse Birthdate: 08-01-1930 Sex: male Admission Date (Current Location): 07/05/2017  [place] and [place] Number:  Engineering geologist and Address:  [facility], [address], [place], [place] [address]      Provider Number: [id number]  Attending Physician Name and Address:  Dustin Flock, MD  Relative Name and Phone Number:       Current Level of Care: Hospital Recommended Level of Care: [place] Prior Approval Number:    Date Approved/Denied:   PASRR Number:  [id number]  Discharge Plan: SNF    Current Diagnoses: Patient Active Problem List   Diagnosis Date Noted  . Acute hyponatremia 07/05/2017  . HTN (hypertension) 07/05/2017  . Diabetes ([place]) 07/05/2017  . HLD (hyperlipidemia) 07/05/2017  . BPH (benign prostatic hyperplasia) 07/05/2017  . CKD (chronic kidney disease), stage III ([place]) 07/05/2017  . CAP (community acquired pneumonia) 07/05/2017    Orientation RESPIRATION BLADDER Height & Weight     Self, Time, Situation, Place  O2(2L) Continent Weight: 180 lb 12.4 oz (82 kg) Height:  5\' 7"  (170.2 cm)  BEHAVIORAL SYMPTOMS/MOOD NEUROLOGICAL BOWEL NUTRITION STATUS      Continent Diet: Heart Healthy/ Carb Modified.   AMBULATORY STATUS COMMUNICATION OF NEEDS Skin   Extensive Assist Verbally(is delayed with some answers) Normal                       Personal Care Assistance Level of Assistance  Bathing, Feeding, Dressing Bathing Assistance: Maximum assistance Feeding assistance: Limited assistance Dressing Assistance: Maximum assistance     Functional Limitations Info  Sight, Hearing, Speech Sight Info: Adequate Hearing Info: Adequate Speech Info: Adequate    SPECIAL CARE FACTORS FREQUENCY  PT (By licensed PT), OT (By licensed OT)     PT Frequency: 5x OT Frequency: 5x            Contractures  Contractures Info: Not present    Additional Factors Info  Code Status, Allergies Code Status Info: Full Code Allergies Info: NKA           Current Medications (07/13/2017):  This is the current hospital active medication list Current Facility-Administered Medications  Medication Dose Route Frequency Provider Last Rate Last Dose  . acetaminophen (TYLENOL) tablet 650 mg  650 mg Oral Q6H PRN Lance Coon, MD   650 mg at 07/10/17 2158   Or  . acetaminophen (TYLENOL) suppository 650 mg  650 mg Rectal Q6H PRN Lance Coon, MD      . amLODipine (NORVASC) tablet 10 mg  10 mg Oral Daily Flora Lipps, MD   10 mg at 07/13/17 0819  . aspirin EC tablet 81 mg  81 mg Oral Daily Lance Coon, MD   81 mg at 07/13/17 2671  . atorvastatin (LIPITOR) tablet 40 mg  40 mg Oral q1800 Lance Coon, MD   40 mg at 07/12/17 1731  . bisacodyl (DULCOLAX) EC tablet 5 mg  5 mg Oral Daily PRN Awilda Bill, NP   5 mg at 07/08/17 2141  . enoxaparin (LOVENOX) injection 40 mg  40 mg Subcutaneous Q24H Lance Coon, MD   40 mg at 07/12/17 2155  . hydrALAZINE (APRESOLINE) injection 10-40 mg  10-40 mg Intravenous Q4H PRN Wilhelmina Mcardle, MD   20 mg at 07/11/17 1416  . insulin aspart (novoLOG) injection 0-5 Units  0-5 Units Subcutaneous QHS Lance Coon, MD  2 Units at 07/12/17 2156  . insulin aspart (novoLOG) injection 0-9 Units  0-9 Units Subcutaneous TID WC Lance Coon, MD   1 Units at 07/13/17 0818  . ipratropium-albuterol (DUONEB) 0.5-2.5 (3) MG/3ML nebulizer solution 3 mL  3 mL Nebulization Q4H PRN Wilhelmina Mcardle, MD      . metoprolol succinate (TOPROL-XL) 24 hr tablet 50 mg  50 mg Oral Daily Flora Lipps, MD   50 mg at 07/13/17 0824  . metoprolol tartrate (LOPRESSOR) injection 2.5-5 mg  2.5-5 mg Intravenous Q3H PRN Wilhelmina Mcardle, MD      . ondansetron [facility]) injection 4 mg  4 mg Intravenous Q6H PRN Lance Coon, MD   4 mg at 07/10/17 0340  . simethicone (MYLICON) chewable tablet 80 mg  80 mg Oral QID  PRN Mikael Spray, NP   80 mg at 07/10/17 0326  . tamsulosin (FLOMAX) capsule 0.4 mg  0.4 mg Oral Daily Lance Coon, MD   0.4 mg at 07/13/17 0820     Discharge Medications: Please see discharge summary for a list of discharge medications.  Relevant Imaging Results:  Relevant Lab Results:   Additional Information SSN: [id number]  Geovanny Sartin, Veronia Beets, LCSW

## 2017-07-13 NOTE — Progress Notes (Signed)
Verbal order received by MD Posey Pronto to D/C foley

## 2017-07-13 NOTE — Progress Notes (Signed)
Inpatient Diabetes Program Recommendations  AACE/ADA: New Consensus Statement on Inpatient Glycemic Control (2015)  Target Ranges:  Prepandial:   less than 140 mg/dL      [facility] postprandial:   less than 180 mg/dL (1-2 hours)      Critically ill patients:  140 - 180 mg/dL  Results for KAPENA, HAMME (MRN [id number]) as of 07/13/2017 11:01  Ref. Range 07/12/2017 07:31 07/12/2017 11:39 07/12/2017 16:39 07/12/2017 21:13 07/13/2017 07:51  Glucose-Capillary Latest Ref Range: 65 - 99 mg/dL 164 (H) 208 (H) 236 (H) 206 (H) 129 (H)    Review of Glycemic Control  Diabetes history: DM2 Outpatient Diabetes medications: None Current orders for Inpatient glycemic control: Novolog 0-9 units TID with meals, Novolog 0-5 units QHS  Inpatient Diabetes Program Recommendations:  Insulin-Meal Coverage: Noted post prandial glucose elevated. Please consider ordering Novolog 3 units TID with meal for meal coverage if patient eats at least 50% of meals.   Thanks, Barnie Alderman, RN, MSN, CDE Diabetes Coordinator Inpatient Diabetes Program [phone] (Team Pager from 8am to 5pm)

## 2017-07-13 NOTE — Progress Notes (Signed)
Swann @ [facility] notified that pt has urinated since foley was removed, and had a bowel movement.

## 2017-07-13 NOTE — Progress Notes (Signed)
Pts  oxygen between 87%-88% on room air, 2 L nasal cannula put back on. MD Posey Pronto notified.

## 2017-07-13 NOTE — Progress Notes (Signed)
Patient is medically stable for D/C to [facility] today. [facility] authorization has been received. Per [facility] liaison patient can come today to room 501. RN will call report to 500 hall nurse at [phone] and arrange EMS for transport. Clinical Education officer, museum (CSW) sent D/C orders to [facility] via [facility]. Patient is aware of above. Patient's son Cletus Gash is at bedside and aware of above. Please reconsult if future social work needs arise. CSW signing off.   [employer], LCSW [phone]

## 2017-07-13 NOTE — Progress Notes (Signed)
Physical Therapy Treatment Patient Details Name: CORAN DIPAOLA MRN: [id number] DOB: February 02, 1931 Today's Date: 07/13/2017    History of Present Illness Pt is an86 y.o.malewith a PMHx of BPH, chronic kidney disease stage III, diabetes mellitus type 2, hypertension, hyperlipidemia, who was admitted to [facility] on1/23/2019for evaluation of altered mental status.  Assessment includes: acute hyponatremia, CAP, HTN, DM, HLD, BPH, and CKD.     PT Comments    Pt in bed, ready for session.  Awaiting transfer to rehab.  To edge of bed with rail and supervision.  Pt did need verbal cues to slow down and wait for bed to be put in proper position and to pay attention to catheter and O2 tubing.  Once sitting, able to sit without support.  Pt required verbal cues for hand placements initially during session.  Pt stood with min assist, mostly for verbal and tactile cues.  Once standing, pt ws cautions and reserved with attempts at standing exercises. After short seated rest to check HR and O2, pt was able to ambulate to recliner at bedside with walker and slow unsteady gait.  Decreased step length and height bilaterally.  He remained standing for several minutes and again after a short seated rest where he marched in place for several minutes.  As session progressed and he was given time to be standing, his overall confidence and hesitancy of movement decreased.  Pt was fatigued however at end of session but remained in recliner for lunch.    HR and O2 remained stable this session on 2 lpm 96% and HR 80-90's.   Follow Up Recommendations  SNF     Equipment Recommendations       Recommendations for Other Services       Precautions / Restrictions Precautions Precautions: Fall Restrictions Weight Bearing Restrictions: No    Mobility  Bed Mobility Overal bed mobility: Needs Assistance Bed Mobility: Supine to Sit     Supine to sit: Supervision        Transfers Overall transfer level: Needs  assistance Equipment used: Rolling walker (2 wheeled) Transfers: Sit to/from Stand Sit to Stand: Min assist         General transfer comment: verbal cues for hand placements initially  Ambulation/Gait Ambulation/Gait assistance: Min guard Ambulation Distance (Feet): 5 Feet Assistive device: Rolling walker (2 wheeled) Gait Pattern/deviations: Step-to pattern;Decreased step length - right;Decreased step length - left   Gait velocity interpretation: <1.8 ft/sec, indicative of risk for recurrent falls General Gait Details: hesitant initially, unsteady but no LOB's or buckling   Stairs            Wheelchair Mobility    Modified Rankin (Stroke Patients Only)       Balance Overall balance assessment: Needs assistance Sitting-balance support: Feet unsupported;Feet supported;Bilateral upper extremity supported Sitting balance-Leahy Scale: Good     Standing balance support: Bilateral upper extremity supported Standing balance-Leahy Scale: Fair Standing balance comment: heavy use of RW for support.                            Cognition Arousal/Alertness: Awake/alert Behavior During Therapy: WFL for tasks assessed/performed Overall Cognitive Status: Within Functional Limits for tasks assessed                                        Exercises Other Exercises Other Exercises: standing activities including  marching in place for several minutes x 3, heel raises x 10 in standing.    General Comments        Pertinent Vitals/Pain Pain Assessment: No/denies pain    Home Living                      Prior Function            PT Goals (current goals can now be found in the care plan section) Progress towards PT goals: Progressing toward goals    Frequency    Min 2X/week      PT Plan Current plan remains appropriate    Co-evaluation              AM-PAC PT "6 Clicks" Daily Activity  Outcome Measure  Difficulty  turning over in bed (including adjusting bedclothes, sheets and blankets)?: A Little Difficulty moving from lying on back to sitting on the side of the bed? : A Little Difficulty sitting down on and standing up from a chair with arms (e.g., wheelchair, bedside commode, etc,.)?: A Little   Help needed walking in hospital room?: A Lot Help needed climbing 3-5 steps with a railing? : A Lot 6 Click Score: 13    End of Session Equipment Utilized During Treatment: Gait belt;Oxygen Activity Tolerance: Patient tolerated treatment well Patient left: in chair;with chair alarm set;with call bell/phone within reach;with family/visitor present         Time: 1975-8832 PT Time Calculation (min) (ACUTE ONLY): 27 min  Charges:  $Gait Training: 8-22 mins $Therapeutic Exercise: 8-22 mins                    G Codes:       Chesley Noon, PTA 07/13/17, 2:04 PM

## 2017-07-13 NOTE — Progress Notes (Signed)
Patient's son Cletus Gash asked [facility] representative (CSW) if patient can change facilities from [facility] to [facility]. Patient has already been discharged and is waiting on EMS to transport to [facility]. CSW made son aware that this late in the afternoon it will not be possible to change facilities and patient will have to discharge to [facility]. CSW explained to son that he can speak to [facility] social worker about changing facilities once he gets to [facility]. [facility] liaison is aware of above and already visited patient and son earlier today.   [employer], LCSW [phone]

## 2017-07-13 NOTE — Progress Notes (Signed)
Report called to Belinda at [employer].Belinda notified the pts foley was removed @ 1430, and pt is on 2 L acute nasal canula. EMS called for transportation.

## 2017-07-13 NOTE — Discharge Instructions (Signed)
[place] at [place]:  Cardiac diet  DISCHARGE CONDITION:  Stable  ACTIVITY:  Activity as tolerated  OXYGEN:  Home Oxygen: Yes.     Oxygen Delivery: 2 liters/min via Patient connected to nasal cannula oxygen  DISCHARGE LOCATION:  nursing home    ADDITIONAL DISCHARGE INSTRUCTION:  Check bmp in 5 days to follow up on sodium  If you experience worsening of your admission symptoms, develop shortness of breath, life threatening emergency, suicidal or homicidal thoughts you must seek medical attention immediately by calling 911 or calling your MD immediately  if symptoms less severe.  You Must read complete instructions/literature along with all the possible adverse reactions/side effects for all the Medicines you take and that have been prescribed to you. Take any new Medicines after you have completely understood and accpet all the possible adverse reactions/side effects.   Please note  You were cared for by a hospitalist during your hospital stay. If you have any questions about your discharge medications or the care you received while you were in the hospital after you are discharged, you can call the unit and asked to speak with the hospitalist on call if the hospitalist that took care of you is not available. Once you are discharged, your primary care physician will handle any further medical issues. Please note that NO REFILLS for any discharge medications will be authorized once you are discharged, as it is imperative that you return to your primary care physician (or establish a relationship with a primary care physician if you do not have one) for your aftercare needs so that they can reassess your need for medications and monitor your lab values.

## 2017-07-13 NOTE — Clinical Social Work Placement (Signed)
   CLINICAL SOCIAL WORK PLACEMENT  NOTE  Date:  07/13/2017  Patient Details  Name: William Morse MRN: [id number] Date of Birth: 09/24/1930  Clinical Social Work is seeking post-discharge placement for this patient at the [place] level of care (*CSW will initial, date and re-position this form in  chart as items are completed):  Yes   Patient/family provided with [facility] Work Department's list of facilities offering this level of care within the geographic area requested by the patient (or if unable, by the patient's family).  Yes   Patient/family informed of their freedom to choose among providers that offer the needed level of care, that participate in Medicare, Medicaid or managed care program needed by the patient, have an available bed and are willing to accept the patient.  Yes   Patient/family informed of [facility]'s ownership interest in [facility] and [facility], as well as of the fact that they are under no obligation to receive care at these facilities.  PASRR submitted to EDS on 07/10/17     PASRR number received on 07/10/17     Existing PASRR number confirmed on       FL2 transmitted to all facilities in geographic area requested by pt/family on 07/10/17     FL2 transmitted to all facilities within larger geographic area on       Patient informed that his/her managed care company has contracts with or will negotiate with certain facilities, including the following:        Yes   Patient/family informed of bed offers received.  Patient chooses bed at ([facility] )     Physician recommends and patient chooses bed at      Patient to be transferred to ([facility] ) on 07/13/17.  Patient to be transferred to facility by [facility] )     Patient family notified on 07/13/17 of transfer.  Name of family member notified:  (Patient's son Cletus Gash is at bedside and aware of D/C today. )     PHYSICIAN Please sign FL2      Additional Comment:    _______________________________________________ Marrio Scribner, Veronia Beets, LCSW 07/13/2017, 2:07 PM

## 2017-07-13 NOTE — Discharge Summary (Signed)
William Morse at [facility], 84 y.o., DOB 11/06/1930, MRN [id number]. Admission date: 07/05/2017 Discharge Date 07/13/2017 Primary MD Leonel Ramsay, MD Admitting Physician Lance Coon, MD  Admission Diagnosis  Hyponatremia [E87.1] Acute pulmonary edema ([place]) [J81.0] Altered mental status, unspecified altered mental status type [R41.82]  Discharge Diagnosis   Principal Problem: Acute hyponatremia likely due to diuretic therapy Acute renal function on chronic kidney disease stage III Ileus Community-acquired pneumonia status post treatment with antibiotic Essential hypertension Hypertension Diet controlled diabetes Hyperlipidemia BPH    Hospital Course 84 y.o.malewith a PMHx of BPH, chronic kidney disease stage III, diabetes mellitus type 2, hypertension, hyperlipidemia, who was admitted to [facility] on1/23/2019for evaluation of altered mental status.  Patient noted to have severe hyponatremia he was admitted to the ICU and started on 3% normal saline.  Patient's sodium was slow to improve.  Seen he was seen by nephrology and now sodium much improved.  Patient also started complaining of shortness of breath and he was thought to have possible pneumonia which was treated with antibiotics.  Patient developed ileus that has since resolved as well. He is very deconditioned and need of rehab.              Consults  pulmonary/intensive care  Significant Tests:  See full reports for all details    Dg Chest 2 View  Result Date: 07/05/2017 CLINICAL DATA:  Shortness of breath, dizziness and weakness. EXAM: CHEST  2 VIEW COMPARISON:  09/29/2016 FINDINGS: Grossly unchanged enlarged cardiac silhouette and mediastinal contours with atherosclerotic plaque within the thoracic aorta. The pulmonary vasculature appears indistinct with cephalization of flow. Worsening right mid and bilateral perihilar heterogeneous opacities. Trace bilateral  effusions. No pneumothorax. No acute osseus abnormalities. IMPRESSION: Findings most worrisome for pulmonary edema with scattered areas of suspected atelectasis, though note, atypical infection could have a similar appearance. A follow-up chest radiograph in 3 to 4 weeks after treatment is recommended to ensure resolution. Electronically Signed   By: Sandi Mariscal M.D.   On: 07/05/2017 19:08   Dg Abd 1 View  Result Date: 07/10/2017 CLINICAL DATA:  Abdominal distention and ileus. EXAM: ABDOMEN - 1 VIEW COMPARISON:  07/08/2017 FINDINGS: Similar pattern of mildly distended and gas-filled colon suggestive of mild colonic ileus. Some gaseous distention of the stomach also present. No gross signs of free air. No significant small bowel dilatation. IMPRESSION: Similar mildly distended gas-filled colon is likely consistent with mild colonic ileus. Electronically Signed   By: Aletta Edouard M.D.   On: 07/10/2017 09:20   Dg Abd 1 View  Result Date: 07/08/2017 CLINICAL DATA:  Abdominal distention EXAM: ABDOMEN - 1 VIEW COMPARISON:  07/06/2017 FINDINGS: Scattered gas within the colon with mild transverse colonic distention. Changes likely to represent ileus. No small bowel distention. No radiopaque stones. Degenerative changes in the spine. Vascular calcifications. IMPRESSION: Mildly distended gas-filled colon most likely to represent ileus. No evidence of small bowel obstruction. Electronically Signed   By: Lucienne Capers M.D.   On: 07/08/2017 05:30   Dg Abd 1 View  Result Date: 07/07/2017 CLINICAL DATA:  Abdominal distention EXAM: ABDOMEN - 1 VIEW COMPARISON:  None. FINDINGS: Colon is diffusely gas-filled without significant distention. Gas-filled nondistended stomach. No small bowel distention. Moderate stool in the rectum. Changes most likely to represent ileus. No radiopaque stones. Degenerative changes in the spine. Vascular calcifications. IMPRESSION: Gas-filled nondistended colon likely to represent ileus.  No findings suggesting obstruction. Electronically Signed   By:  Lucienne Capers M.D.   On: 07/07/2017 00:27   Ct Head Wo Contrast  Result Date: 07/05/2017 CLINICAL DATA:  Diaphoresis, encephalopathy. EXAM: CT HEAD WITHOUT CONTRAST TECHNIQUE: Contiguous axial images were obtained from the base of the skull through the vertex without intravenous contrast. COMPARISON:  None. FINDINGS: Brain: No evidence of acute infarction, hemorrhage, hydrocephalus, extra-axial collection or mass lesion/mass effect. Mild age related involutional changes of the brain. Vascular: No hyperdense vessels. Skull: No acute fracture or suspicious osseous lesions. Sinuses/Orbits: Trace air-fluid level in the right maxillary sinus. Otherwise the paranasal sinuses are clear as are the mastoids. Intact orbits and globes. Other: None IMPRESSION: Mild age related involutional changes of the brain. No acute intracranial abnormality. Trace fluid in the right maxillary sinus may represent mild sinusitis. Electronically Signed   By: Ashley Royalty M.D.   On: 07/05/2017 19:29   US Renal  Result Date: 07/06/2017 CLINICAL DATA:  Acute renal failure EXAM: RENAL / URINARY TRACT ULTRASOUND COMPLETE COMPARISON:  None in PACs FINDINGS: Right Kidney: Length: 10.1 cm. The renal cortical echotexture is approximately equal to that of the adjacent liver. There is no hydronephrosis. Left Kidney: Length: 11.8 cm. The renal cortical echotexture is similar to that on the right. There is no hydronephrosis. Bladder: Appears normal for degree of bladder distention. IMPRESSION: No hydronephrosis. No acute abnormality of either kidney. Normal appearing urinary bladder. Electronically Signed   By: David  Martinique M.D.   On: 07/06/2017 10:04   Dg Chest Port 1 View  Result Date: 07/12/2017 CLINICAL DATA:  Shortness of breath EXAM: PORTABLE CHEST 1 VIEW COMPARISON:  07/11/2017 FINDINGS: Cardiopericardial enlargement and vascular pedicle widening that is stable. Low  volume chest with haziness attributed to pleural fluid and parenchymal opacity. No Kerley lines. No pneumothorax or air bronchogram. IMPRESSION: 1. Low volume chest with small pleural effusions and lower lobe opacification. Atelectasis or infection are both possible. 2. Cardiomegaly. Electronically Signed   By: Monte Fantasia M.D.   On: 07/12/2017 10:26   Dg Chest Port 1 View  Result Date: 07/11/2017 CLINICAL DATA:  84 year old male with SIADH. Shortness of breath, abdominal distension. EXAM: PORTABLE CHEST 1 VIEW COMPARISON:  07/05/2017 FINDINGS: Upright AP view at 0527 hours. Stable cardiac size and mediastinal contours. Increased bilateral retrocardiac opacity. Suggestion of small right pleural effusion tracking into the minor fissure. No pneumothorax. Normalized pulmonary vascularity since 07/05/2017. IMPRESSION: 1. Progressed bilateral lower lobe collapse or consolidation since 07/05/2017. 2. Small right pleural effusion suspected. Possible small left pleural effusion. 3. Normalized pulmonary vascularity, no edema. Electronically Signed   By: Genevie Ann M.D.   On: 07/11/2017 07:26       Today   Subjective:   William Morse patient doing better breathing improved  Objective:   Blood pressure (!) 161/59, pulse 83, temperature 98.1 F (36.7 C), temperature source Oral, resp. rate 18, height 5\' 7"  (1.702 m), weight 180 lb 12.4 oz (82 kg), SpO2 94 %.  .  Intake/Output Summary (Last 24 hours) at 07/13/2017 1343 Last data filed at 07/13/2017 1043 Gross per 24 hour  Intake 840 ml  Output 1100 ml  Net -260 ml    Exam VITAL SIGNS: Blood pressure (!) 161/59, pulse 83, temperature 98.1 F (36.7 C), temperature source Oral, resp. rate 18, height 5\' 7"  (1.702 m), weight 180 lb 12.4 oz (82 kg), SpO2 94 %.  GENERAL:  84 y.o.-year-old patient lying in the bed with no acute distress.  EYES: Pupils equal, round, reactive to light and  accommodation. No scleral icterus. Extraocular muscles intact.   HEENT: Head atraumatic, normocephalic. Oropharynx and nasopharynx clear.  NECK:  Supple, no jugular venous distention. No thyroid enlargement, no tenderness.  LUNGS: Normal breath sounds bilaterally, no wheezing, rales,rhonchi or crepitation. No use of accessory muscles of respiration.  CARDIOVASCULAR: S1, S2 normal. No murmurs, rubs, or gallops.  ABDOMEN: Soft, nontender, nondistended. Bowel sounds present. No organomegaly or mass.  EXTREMITIES: + pedal edema, cyanosis, or clubbing.  NEUROLOGIC: Cranial nerves II through XII are intact. Muscle strength 5/5 in all extremities. Sensation intact. Gait not checked.  PSYCHIATRIC: The patient is alert and oriented x 3.  SKIN: No obvious rash, lesion, or ulcer.   Data Review     CBC w Diff:  Lab Results  Component Value Date   WBC 9.4 07/11/2017   HGB 9.9 (L) 07/11/2017   HCT 29.2 (L) 07/11/2017   PLT 283 07/11/2017   CMP:  Lab Results  Component Value Date   NA 131 (L) 07/13/2017   K 4.6 07/13/2017   CL 103 07/13/2017   CO2 22 07/13/2017   BUN 47 (H) 07/13/2017   CREATININE 1.54 (H) 07/13/2017   PROT 5.5 (L) 07/11/2017   ALBUMIN 2.5 (L) 07/11/2017   BILITOT 0.4 07/11/2017   ALKPHOS 40 07/11/2017   AST 29 07/11/2017   ALT 31 07/11/2017  .  Micro Results Recent Results (from the past 240 hour(s))  Blood culture (routine x 2)     Status: None   Collection Time: 07/05/17  9:45 PM  Result Value Ref Range Status   Specimen Description BLOOD LT HAND  Final   Special Requests   Final    BOTTLES DRAWN AEROBIC AND ANAEROBIC Blood Culture adequate volume   Culture   Final    NO GROWTH 5 DAYS Performed at [facility], [address] [place], [place] [address]    Report Status 07/10/2017 FINAL  Final  Blood culture (routine x 2)     Status: None   Collection Time: 07/05/17  9:45 PM  Result Value Ref Range Status   Specimen Description BLOOD RT [facility]  Final   Special Requests   Final    BOTTLES DRAWN AEROBIC AND  ANAEROBIC Blood Culture adequate volume   Culture   Final    NO GROWTH 5 DAYS Performed at [facility], [address], [place], [place] [address]    Report Status 07/10/2017 FINAL  Final  MRSA PCR Screening     Status: None   Collection Time: 07/06/17  7:16 AM  Result Value Ref Range Status   MRSA by PCR NEGATIVE NEGATIVE Final    Comment:        The GeneXpert MRSA Assay (FDA approved for NASAL specimens only), is one component of a comprehensive MRSA colonization surveillance program. It is not intended to diagnose MRSA infection nor to guide or monitor treatment for MRSA infections. Performed at [facility], [address] [place], [place] [address]         Code Status Orders  (From admission, onward)        Start     Ordered   07/06/17 0033  Full code  Continuous     07/06/17 0032    Code Status History    Date Active Date Inactive Code Status Order ID Comments User Context   This patient has a current code status but no historical code status.          Contact information  for after-discharge care    Destination    [facility] .   Service:  Skilled Nursing Contact information: [address] [place] [phone]              Discharge Medications   Allergies as of 07/13/2017   No Known Allergies     Medication List    STOP taking these medications   lisinopril-hydrochlorothiazide 20-12.5 MG tablet Commonly known as:  PRINZIDE,ZESTORETIC     TAKE these medications   amLODipine 10 MG tablet Commonly known as:  NORVASC Take 10 mg by mouth daily.   aspirin EC 81 MG tablet Take 81 mg by mouth daily.   metoprolol succinate 100 MG 24 hr tablet Commonly known as:  TOPROL-XL Take 100 mg by mouth daily. Take with or immediately following a meal.   sertraline 50 MG tablet Commonly known as:  ZOLOFT Take 50 mg by mouth daily.   simvastatin 80 MG tablet Commonly known as:   ZOCOR Take 80 mg by mouth daily.   tamsulosin 0.4 MG Caps capsule Commonly known as:  FLOMAX Take 0.4 mg by mouth daily.          Total Time in preparing paper work, data evaluation and todays exam - 35 minutes  Dustin Flock M.D on 07/13/2017 at 1:43 PM  [facility]   Office  [phone]

## 2017-07-15 DIAGNOSIS — E119 Type 2 diabetes mellitus without complications: Secondary | ICD-10-CM | POA: Diagnosis not present

## 2017-07-15 DIAGNOSIS — E785 Hyperlipidemia, unspecified: Secondary | ICD-10-CM | POA: Diagnosis not present

## 2017-07-15 DIAGNOSIS — F419 Anxiety disorder, unspecified: Secondary | ICD-10-CM | POA: Diagnosis not present

## 2017-07-15 DIAGNOSIS — E871 Hypo-osmolality and hyponatremia: Secondary | ICD-10-CM | POA: Diagnosis not present

## 2017-07-15 DIAGNOSIS — F329 Major depressive disorder, single episode, unspecified: Secondary | ICD-10-CM | POA: Diagnosis not present

## 2017-07-15 DIAGNOSIS — I10 Essential (primary) hypertension: Secondary | ICD-10-CM | POA: Diagnosis not present

## 2017-07-15 DIAGNOSIS — R4182 Altered mental status, unspecified: Secondary | ICD-10-CM | POA: Diagnosis not present

## 2017-08-11 DEATH — deceased

## 2018-09-29 IMAGING — DX DG ABDOMEN 1V
1 series · 1 of 1 positions shown · non-contrast
Comparison: 07/06/2017

CLINICAL DATA: Abdominal distention

EXAM:
ABDOMEN - 1 VIEW

[abdomen kub]
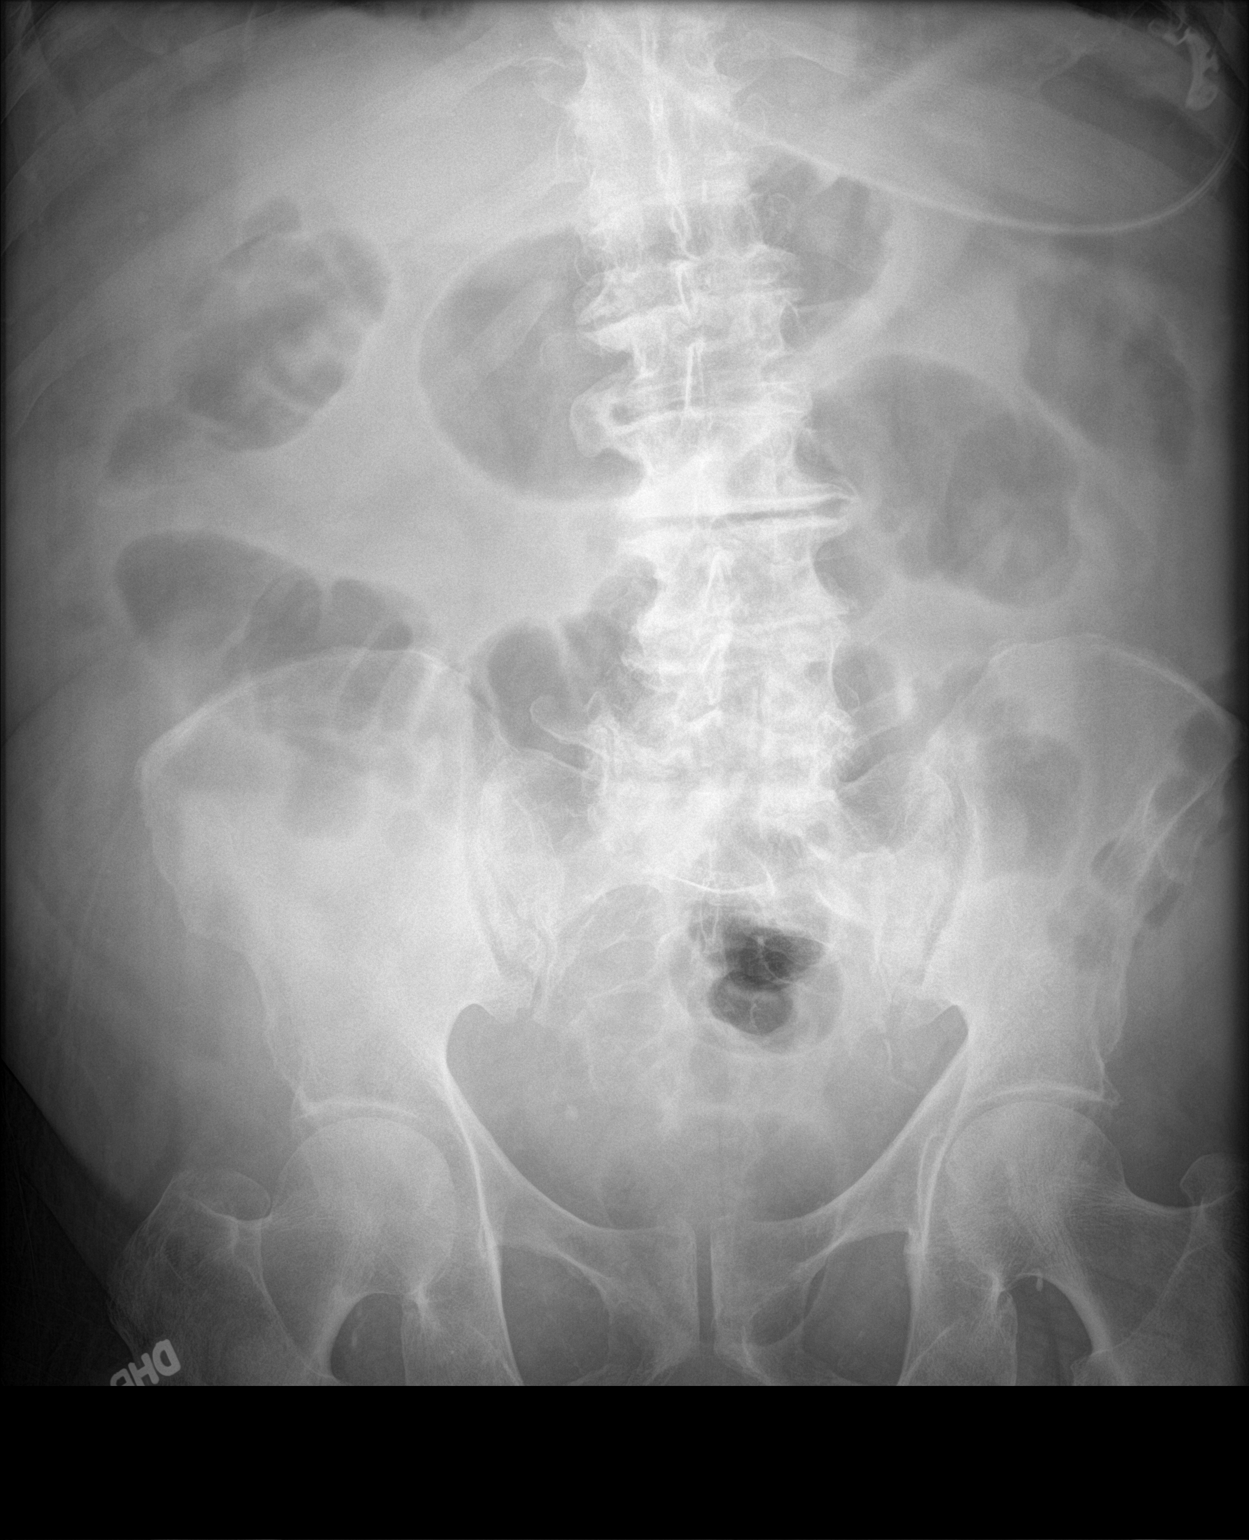

[1 of 1 positions shown; findings below may reference images not displayed]

FINDINGS: Scattered gas within the colon with mild transverse colonic
distention. Changes likely to represent ileus. No small bowel
distention. No radiopaque stones. Degenerative changes in the spine.
Vascular calcifications.
IMPRESSION: Mildly distended gas-filled colon most likely to represent ileus. No
evidence of small bowel obstruction.

## 2018-10-02 IMAGING — DX DG CHEST 1V PORT
1 series · 1 of 1 positions shown · non-contrast
Comparison: 07/05/2017

CLINICAL DATA: 86-year-old male with SIADH. Shortness of breath,
abdominal distension.

EXAM:
PORTABLE CHEST 1 VIEW

[chest ap]
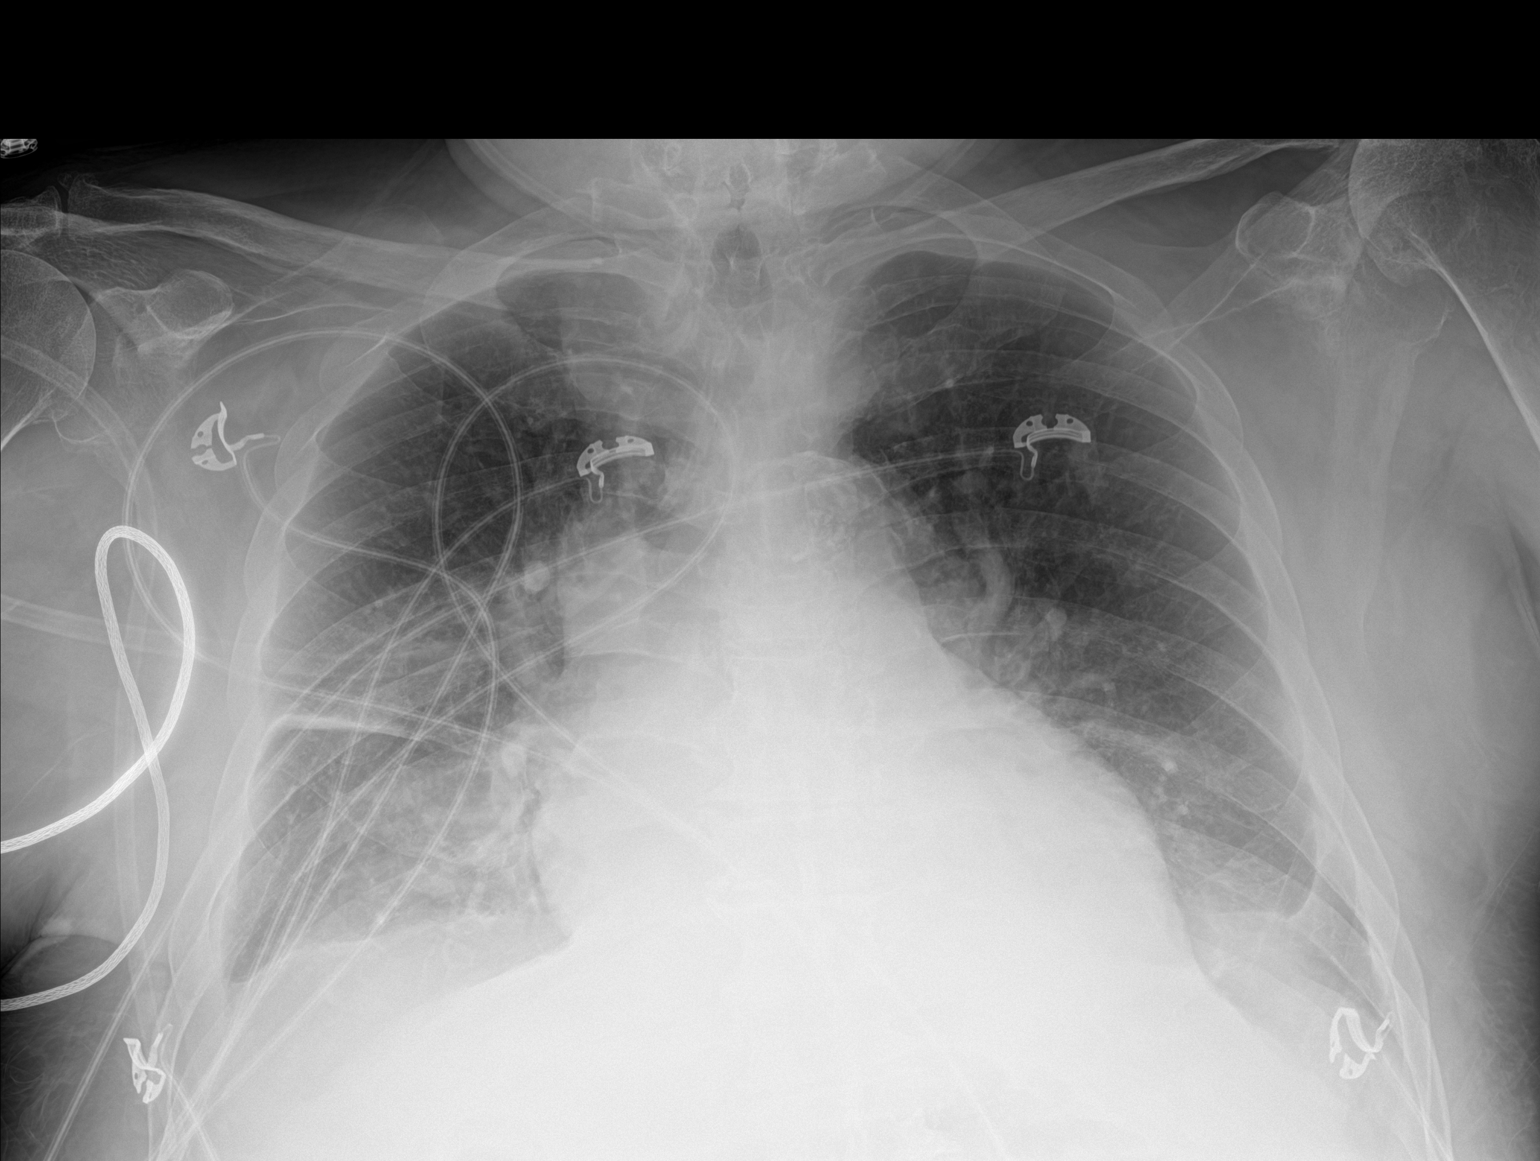

[1 of 1 positions shown; findings below may reference images not displayed]

FINDINGS: Upright AP view at 0648 hours. Stable cardiac size and mediastinal
contours. Increased bilateral retrocardiac opacity. Suggestion of
small right pleural effusion tracking into the minor fissure. No
pneumothorax. Normalized pulmonary vascularity since 07/05/2017.
IMPRESSION: 1. Progressed bilateral lower lobe collapse or consolidation since
07/05/2017.
[DATE]. Small right pleural effusion suspected. Possible small left
pleural effusion.
3. Normalized pulmonary vascularity, no edema.
